# Patient Record
Sex: Female | Born: 1970 | Race: White | Hispanic: No | Marital: Married | Smoking: Former smoker
Health system: Southern US, Community
[De-identification: ages and names within clinical notes are randomized; demographics above are authoritative.]

## PROBLEM LIST (undated history)

## (undated) DIAGNOSIS — J302 Other seasonal allergic rhinitis: Secondary | ICD-10-CM

## (undated) DIAGNOSIS — D649 Anemia, unspecified: Secondary | ICD-10-CM

## (undated) DIAGNOSIS — Z8669 Personal history of other diseases of the nervous system and sense organs: Secondary | ICD-10-CM

## (undated) DIAGNOSIS — Z87442 Personal history of urinary calculi: Secondary | ICD-10-CM

## (undated) HISTORY — DX: Anemia, unspecified: D64.9

## (undated) HISTORY — PX: TUBAL LIGATION: SHX77

## (undated) HISTORY — DX: Other seasonal allergic rhinitis: J30.2

## (undated) HISTORY — DX: Personal history of urinary calculi: Z87.442

## (undated) HISTORY — DX: Personal history of other diseases of the nervous system and sense organs: Z86.69

## (undated) HISTORY — PX: OTHER SURGICAL HISTORY: SHX169

---

## 2018-02-23 LAB — HM PAP SMEAR: HM Pap smear: NEGATIVE

## 2018-11-27 ENCOUNTER — Encounter: Payer: Self-pay | Admitting: Podiatry

## 2018-11-27 ENCOUNTER — Ambulatory Visit (INDEPENDENT_AMBULATORY_CARE_PROVIDER_SITE_OTHER): Payer: Managed Care, Other (non HMO)

## 2018-11-27 ENCOUNTER — Ambulatory Visit: Payer: Managed Care, Other (non HMO) | Admitting: Podiatry

## 2018-11-27 ENCOUNTER — Other Ambulatory Visit: Payer: Self-pay

## 2018-11-27 VITALS — BP 125/79 | HR 84 | Temp 97.6°F | Resp 16

## 2018-11-27 DIAGNOSIS — M7752 Other enthesopathy of left foot: Secondary | ICD-10-CM | POA: Diagnosis not present

## 2018-11-27 DIAGNOSIS — M21962 Unspecified acquired deformity of left lower leg: Secondary | ICD-10-CM

## 2018-11-27 DIAGNOSIS — M25572 Pain in left ankle and joints of left foot: Secondary | ICD-10-CM | POA: Diagnosis not present

## 2018-11-27 DIAGNOSIS — Q828 Other specified congenital malformations of skin: Secondary | ICD-10-CM | POA: Diagnosis not present

## 2018-11-27 DIAGNOSIS — M898X9 Other specified disorders of bone, unspecified site: Secondary | ICD-10-CM | POA: Diagnosis not present

## 2018-11-27 NOTE — Progress Notes (Signed)
  Subjective:  Patient ID: Martha Burgess, female    DOB: 1971/02/07,  MRN: [id number]  Chief Complaint  Patient presents with  . foot lesion    Lt sub met 3-4th lesion x couple yrs; sore -pt states it's hard to walk on it" Tx: OTC callus removal -pt denei sN/V/F/Ch     48 y.o. female presents with the above complaint. Hx above confirmed with patient. States it has been shaved down professionally.  Also complains of a painful bony area on the top of the arch of her foot that prevents her from wearing close toed shoes as it irritates her.   Review of Systems: Negative except as noted in the HPI. Denies N/V/F/Ch.  No past medical history on file. No current outpatient medications on file.  Social History   Tobacco Use  Smoking Status Current Every Day Smoker  . Packs/day: 0.50  . Types: Cigarettes  Smokeless Tobacco Never Used    Not on File Objective:   Vitals:   11/27/18 1559  BP: 125/79  Pulse: 84  Resp: 16  Temp: 97.6 F (36.4 C)   There is no height or weight on file to calculate BMI. Constitutional Well developed. Well nourished.  Vascular Dorsalis pedis pulses palpable bilaterally. Posterior tibial pulses palpable bilaterally. Capillary refill normal to all digits.  No cyanosis or clubbing noted. Pedal hair growth normal.  Neurologic Normal speech. Oriented to person, place, and time. Epicritic sensation to light touch grossly present bilaterally.  Dermatologic Nails well groomed and normal in appearance. No open wounds. Deep hyperkeratosis about the plantar aspect of the third/fourth metatarsal head area  Orthopedic:  Pain palpation about the left third metatarsal phalangeal joint   Radiographs: Bony exostosis over the first tarsometatarsal joint area.  Malformation noted to the medial cuneiform.  Shortened first metatarsal.  Elongated second third fourth metatarsals.  Assessment:   1. Metatarsal deformity, left   2. Bony exostosis   3. Pain in joint of  left foot   4. Capsulitis of metatarsophalangeal (MTP) joint of left foot    Plan:  Patient was evaluated and treated and all questions answered.  Shortened first metatarsal elongated second third fourth metatarsals with deep hyperkeratosis and first tarsometatarsal exostosis -XR reviewed as above. -Debrided lesion for patient with 312 and 15 blade to patient relief. -Discussed with the surgical correction with patient due to the above deformities -Patient has failed all conservative therapy and wishes to proceed with surgical intervention. All risks, benefits, and alternatives discussed with patient. No guarantees given. Consent reviewed and signed by patient. -Planned procedures: Distraction arthrodesis first tarsometatarsal joint, second third possible fourth metatarsal shortening osteotomies.  Procedures may vary.  -Discussed with patient that she will need to be nonweightbearing after procedure.  Patient verbalized understanding  No follow-ups on file.

## 2018-11-27 NOTE — Patient Instructions (Signed)
Pre-Operative Instructions  Congratulations, you have decided to take an important step towards improving your quality of life.  You can be assured that the doctors and staff at [facility] will be with you every step of the way.  Here are some important things you should know:  1. Plan to be at the surgery center/hospital at least 1 (one) hour prior to your scheduled time, unless otherwise directed by the surgical center/hospital staff.  You must have a responsible adult accompany you, remain during the surgery and drive you home.  Make sure you have directions to the surgical center/hospital to ensure you arrive on time. 2. If you are having surgery at [facility] or [facility] hospitals, you will need a copy of your medical history and physical form from your family physician within one month prior to the date of surgery. We will give you a form for your primary physician to complete.  3. We make every effort to accommodate the date you request for surgery.  However, there are times where surgery dates or times have to be moved.  We will contact you as soon as possible if a change in schedule is required.   4. No aspirin/ibuprofen for one week before surgery.  If you are on aspirin, any non-steroidal anti-inflammatory medications (Mobic, Aleve, Ibuprofen) should not be taken seven (7) days prior to your surgery.  You make take Tylenol for pain prior to surgery.  5. Medications - If you are taking daily heart and blood pressure medications, seizure, reflux, allergy, asthma, anxiety, pain or diabetes medications, make sure you notify the surgery center/hospital before the day of surgery so they can tell you which medications you should take or avoid the day of surgery. 6. No food or drink after midnight the night before surgery unless directed otherwise by surgical center/hospital staff. 7. No alcoholic beverages 24-hours prior to surgery.  No smoking 24-hours prior or 24-hours after  surgery. 8. Wear loose pants or shorts. They should be loose enough to fit over bandages, boots, and casts. 9. Don't wear slip-on shoes. Sneakers are preferred. 10. Bring your boot with you to the surgery center/hospital.  Also bring crutches or a walker if your physician has prescribed it for you.  If you do not have this equipment, it will be provided for you after surgery. 11. If you have not been contacted by the surgery center/hospital by the day before your surgery, call to confirm the date and time of your surgery. 12. Leave-time from work may vary depending on the type of surgery you have.  Appropriate arrangements should be made prior to surgery with your employer. 13. Prescriptions will be provided immediately following surgery by your doctor.  Fill these as soon as possible after surgery and take the medication as directed. Pain medications will not be refilled on weekends and must be approved by the doctor. 14. Remove nail polish on the operative foot and avoid getting pedicures prior to surgery. 15. Wash the night before surgery.  The night before surgery wash the foot and leg well with water and the antibacterial soap provided. Be sure to pay special attention to beneath the toenails and in between the toes.  Wash for at least three (3) minutes. Rinse thoroughly with water and dry well with a towel.  Perform this wash unless told not to do so by your physician.  Enclosed: 1 Ice pack (please put in freezer the night before surgery)   1 Hibiclens skin cleaner     Pre-op instructions  If you have any questions regarding the instructions, please do not hesitate to call our office.  [place]: [address], [place], [place] [address] -- [phone]  [place]: [address] [place], [place] [address] -- [phone]  [place]: [address]  , [place] [address] -- [phone]  [place]: [address], [address], [place],  [address] -- [phone]  Website:  [email or web address] 

## 2018-11-27 NOTE — Progress Notes (Signed)
   Subjective:    Patient ID: Martha Burgess, female    DOB: [birth year], 48 y.o.   MRN: [id number]  HPI    Review of Systems  All other systems reviewed and are negative.      Objective:   Physical Exam        Assessment & Plan:

## 2018-11-28 ENCOUNTER — Telehealth: Payer: Self-pay | Admitting: *Deleted

## 2018-11-28 NOTE — Telephone Encounter (Signed)
"  Dr. Samuella Cota told me to give you a call about scheduling my surgery."  He does surgeries on Wednesdays.  Do you have a date that you would like?  "He said to schedule it within a month's time."  He can do it on December 27, 2018.  That date will be fine."  I'll get it scheduled.  Someone from the surgical center will give you a call a day or two prior to your surgery date.  They will give you your arrival time.  You need to register online with the surgical center through their portal, instructions are in the brochure that we gave you.

## 2018-12-22 ENCOUNTER — Telehealth: Payer: Self-pay | Admitting: *Deleted

## 2018-12-22 NOTE — Telephone Encounter (Signed)
DOS 12/27/2018, CPT CODES: 79038 X3 - METATARSAL OSTEOTOMY 2,3,4 AND 33383 - 1ST TMT ARTHODESIS  [employer]: Effective Date - Benefit Begin Date: 09/17/2018    In-Network:  Co-Insurance:  0% Individual   Deductible:  --  Individual  $3,000 per Calendar Year  Individual  $3,000 Remaining  Benefit does apply to member's out-of-pocket maximum   Out-Of-Pocket  Max:$4,000 per Calendar Year Individual  $3,736.87 Remaining Individual   PRE-CERT IS NOT REQUIRED  [address] - [phone] CONFIRM #) [id number] - ([phone] CONFIRM#)

## 2018-12-27 ENCOUNTER — Other Ambulatory Visit: Payer: Self-pay | Admitting: *Deleted

## 2018-12-27 ENCOUNTER — Encounter: Payer: Self-pay | Admitting: Podiatry

## 2018-12-27 ENCOUNTER — Other Ambulatory Visit: Payer: Self-pay | Admitting: Podiatry

## 2018-12-27 ENCOUNTER — Telehealth: Payer: Self-pay | Admitting: *Deleted

## 2018-12-27 DIAGNOSIS — M21542 Acquired clubfoot, left foot: Secondary | ICD-10-CM | POA: Diagnosis not present

## 2018-12-27 DIAGNOSIS — M21962 Unspecified acquired deformity of left lower leg: Secondary | ICD-10-CM

## 2018-12-27 DIAGNOSIS — M13872 Other specified arthritis, left ankle and foot: Secondary | ICD-10-CM | POA: Diagnosis not present

## 2018-12-27 DIAGNOSIS — M778 Other enthesopathies, not elsewhere classified: Secondary | ICD-10-CM

## 2018-12-27 MED ORDER — CEPHALEXIN 500 MG PO CAPS: 500.0000 mg | ORAL_CAPSULE | Freq: Two times a day (BID) | ORAL | 0 refills | Status: DC

## 2018-12-27 MED ORDER — ONDANSETRON HCL 4 MG PO TABS: 4.0000 mg | ORAL_TABLET | Freq: Three times a day (TID) | ORAL | 0 refills | Status: DC | PRN

## 2018-12-27 MED ORDER — OXYCODONE-ACETAMINOPHEN 10-325 MG PO TABS: 1.0000 | ORAL_TABLET | ORAL | 0 refills | Status: DC | PRN

## 2018-12-27 MED ORDER — OXYCODONE-ACETAMINOPHEN 10-325 MG PO TABS: 1.0000 | ORAL_TABLET | Freq: Four times a day (QID) | ORAL | 0 refills | Status: DC | PRN

## 2018-12-27 NOTE — Telephone Encounter (Signed)
[facility] - Martha Burgess states pt is opioid naive and will need to change directions to every 6 hour rather than every 4 hours.

## 2018-12-27 NOTE — Progress Notes (Signed)
I placed an order for a knee scooter per Dr. March Rummage.  I sent a message to Jolee Ewing of [place] requesting her assistance in getting this for the patient.

## 2018-12-27 NOTE — Telephone Encounter (Signed)
I informed [facility] - Andris Flurry, it would be fine to change pt to percocet every 6 hours prn pain.

## 2018-12-27 NOTE — Progress Notes (Signed)
Rx sent to pharmacy for outpatient surgery. °

## 2018-12-28 ENCOUNTER — Encounter: Payer: Self-pay | Admitting: Podiatry

## 2018-12-28 ENCOUNTER — Telehealth: Payer: Self-pay

## 2018-12-28 NOTE — Telephone Encounter (Signed)
POST OP CALL-    1) General condition stated by the patient:   2) Is the pt having pain? Yes but manageable  3) Pain score:   4) Has the pt taken Rx'd pain medication, regularly or PRN?   5) Is the pain medication giving relief?  6) Any fever, chills, nausea, or vomiting, shortness of breath or tightness in calf? None  7) Is the bandage clean, dry and intact? Yes  8) Is there excessive tightness, bleeding or drainage coming through the bandage?Pt states hasn't examined due to wearing the boot but hasn't noticed anything.   9) Did you understand all of the post op instruction sheet given? Yes  10) Any questions or concerns regarding post op care/recovery? Pt wanted to ask if boot could be removed. Advised not to remove boot, as it is necessary to offload pressure to prevent complications or injury. Instructed patient it would be okay to remove boot temporarily to check bandage for any issues.   Confirmed POV appointment with patient

## 2019-01-01 ENCOUNTER — Ambulatory Visit (INDEPENDENT_AMBULATORY_CARE_PROVIDER_SITE_OTHER): Payer: Managed Care, Other (non HMO)

## 2019-01-01 ENCOUNTER — Ambulatory Visit (INDEPENDENT_AMBULATORY_CARE_PROVIDER_SITE_OTHER): Payer: Managed Care, Other (non HMO) | Admitting: Podiatry

## 2019-01-01 ENCOUNTER — Other Ambulatory Visit: Payer: Self-pay

## 2019-01-01 DIAGNOSIS — M21542 Acquired clubfoot, left foot: Secondary | ICD-10-CM

## 2019-01-01 DIAGNOSIS — M7752 Other enthesopathy of left foot: Secondary | ICD-10-CM

## 2019-01-01 DIAGNOSIS — M21962 Unspecified acquired deformity of left lower leg: Secondary | ICD-10-CM

## 2019-01-01 DIAGNOSIS — Z9889 Other specified postprocedural states: Secondary | ICD-10-CM

## 2019-01-01 MED ORDER — HYDROCODONE-ACETAMINOPHEN 10-325 MG PO TABS: 1.0000 | ORAL_TABLET | ORAL | 0 refills | Status: DC | PRN

## 2019-01-01 NOTE — Progress Notes (Signed)
  Subjective:  Patient ID: Martha Burgess, female    DOB: May 24, 1971,  MRN: [id number]  Chief Complaint  Patient presents with  . Routine Post Op    POV#1 DOS 12/27/2018 METATARSAL OSTEOTOMY 2,3,4 AND 1ST TMT ARTHODESIS LT  Patient states has been doing well just staying resting as much as possible  patient denies N/V/F/CH  pain has been controlled     DOS: 12/27/2018 Procedure: L 1st TMT arthrodesis, weil osteotomies 2-4  48 y.o. female returns for post-op check. Doing well. Pain controlled with med but thinks the med is strong and makes her itch. States her knee scooter tipped in the waiting room and put pressure on her foot and had a sudden increase in pain.  Review of Systems: Negative except as noted in the HPI. Denies N/V/F/Ch.  No past medical history on file.  Current Outpatient Medications:  .  cephALEXin (KEFLEX) 500 MG capsule, Take 1 capsule (500 mg total) by mouth 2 (two) times daily., Disp: 14 capsule, Rfl: 0 .  HYDROcodone-acetaminophen (NORCO) 10-325 MG tablet, Take 1 tablet by mouth every 4 (four) hours as needed., Disp: 20 tablet, Rfl: 0 .  ondansetron (ZOFRAN) 4 MG tablet, Take 1 tablet (4 mg total) by mouth every 8 (eight) hours as needed for nausea or vomiting., Disp: 20 tablet, Rfl: 0 .  oxyCODONE-acetaminophen (PERCOCET) 10-325 MG tablet, Take 1 tablet by mouth every 6 (six) hours as needed for pain., Disp: 20 tablet, Rfl: 0  Social History   Tobacco Use  Smoking Status Current Every Day Smoker  . Packs/day: 0.50  . Types: Cigarettes  Smokeless Tobacco Never Used    Not on File Objective:  There were no vitals filed for this visit. There is no height or weight on file to calculate BMI. Constitutional Well developed. Well nourished.  Vascular Foot warm and well perfused. Capillary refill normal to all digits.   Neurologic Normal speech. Oriented to person, place, and time. Epicritic sensation to light touch grossly present bilaterally. Diminished over  the 2nd toe.  Dermatologic Skin healing well without signs of infection. Skin edges well coapted without signs of infection.  Orthopedic: Tenderness to palpation noted about the surgical site.   Radiographs: Taken and reviewed c/w post-op state hardware intact 1st TMT appears good alignment. Metatarsal parabola improvved. Assessment:   1. Post-operative state   2. Metatarsal deformity, left   3. Capsulitis of metatarsophalangeal (MTP) joint of left foot    Plan:  Patient was evaluated and treated and all questions answered.  S/p foot surgery left -Progressing as expected post-operatively. -XR: Taken and reviewed c/w post-op state -WB Status: NWB in CAM boot with knee scooter -Sutures: intact. -Medications: Rx Norco for pain -Discussed intra-op complication of tendon and skin injury. Patient verbalized understanding. -Foot redressed.  No follow-ups on file.

## 2019-01-08 ENCOUNTER — Other Ambulatory Visit: Payer: Self-pay

## 2019-01-08 ENCOUNTER — Ambulatory Visit (INDEPENDENT_AMBULATORY_CARE_PROVIDER_SITE_OTHER): Payer: Managed Care, Other (non HMO) | Admitting: Podiatry

## 2019-01-08 ENCOUNTER — Encounter: Payer: Self-pay | Admitting: Podiatry

## 2019-01-08 VITALS — BP 98/66 | HR 98 | Temp 98.7°F | Resp 16

## 2019-01-08 DIAGNOSIS — Z9889 Other specified postprocedural states: Secondary | ICD-10-CM

## 2019-01-15 ENCOUNTER — Encounter: Payer: Self-pay | Admitting: Podiatry

## 2019-01-15 ENCOUNTER — Other Ambulatory Visit: Payer: Self-pay

## 2019-01-15 ENCOUNTER — Ambulatory Visit (INDEPENDENT_AMBULATORY_CARE_PROVIDER_SITE_OTHER): Payer: Managed Care, Other (non HMO) | Admitting: Podiatry

## 2019-01-15 VITALS — Temp 98.6°F | Resp 16

## 2019-01-15 DIAGNOSIS — Z9889 Other specified postprocedural states: Secondary | ICD-10-CM

## 2019-01-15 MED ORDER — GABAPENTIN 300 MG PO CAPS: 300.0000 mg | ORAL_CAPSULE | Freq: Every day | ORAL | 0 refills | Status: DC

## 2019-01-15 MED ORDER — VITAMIN C 500 MG PO TABS: 500.0000 mg | ORAL_TABLET | Freq: Every day | ORAL | 0 refills | Status: DC

## 2019-01-15 NOTE — Progress Notes (Signed)
  Subjective:  Patient ID: Martha Burgess, female    DOB: 1970/07/30,  MRN: [id number]  Chief Complaint  Patient presents with  . Routine Post Op    OV#3 DOS 12/27/2018 METATARSAL OSTEOTOMY 2,3,4 AND 1ST TMT ARTHODESIS LT-pt states," doing pretty good, but been having a lot of tingiling in my toes and numb on my 2nd and 3rd toes; 2-3/10 pian." Tx: boot, scooter, PRN meds, icing and elevaiotn     DOS: 12/27/2018 Procedure: L 1st TMT arthrodesis, weil osteotomies 2-4  48 y.o. female returns for post-op check.  History above confirmed with patient pain controlled but having some tingling in the 2nd and 3rd toes.  Review of Systems: Negative except as noted in the HPI. Denies N/V/F/Ch.  No past medical history on file.  Current Outpatient Medications:  .  cephALEXin (KEFLEX) 500 MG capsule, Take 1 capsule (500 mg total) by mouth 2 (two) times daily., Disp: 14 capsule, Rfl: 0 .  HYDROcodone-acetaminophen (NORCO) 10-325 MG tablet, Take 1 tablet by mouth every 4 (four) hours as needed., Disp: 20 tablet, Rfl: 0 .  ondansetron (ZOFRAN) 4 MG tablet, Take 1 tablet (4 mg total) by mouth every 8 (eight) hours as needed for nausea or vomiting., Disp: 20 tablet, Rfl: 0 .  oxyCODONE-acetaminophen (PERCOCET) 10-325 MG tablet, Take 1 tablet by mouth every 6 (six) hours as needed for pain., Disp: 20 tablet, Rfl: 0 .  gabapentin (NEURONTIN) 300 MG capsule, Take 1 capsule (300 mg total) by mouth at bedtime. After 1 week take twice daily. After another week take three times daily., Disp: 30 capsule, Rfl: 0 .  vitamin C (ASCORBIC ACID) 500 MG tablet, Take 1 tablet (500 mg total) by mouth daily., Disp: 30 tablet, Rfl: 0  Social History   Tobacco Use  Smoking Status Current Every Day Smoker  . Packs/day: 0.50  . Types: Cigarettes  Smokeless Tobacco Never Used    Not on File Objective:   Vitals:   01/15/19 1620  Resp: 16  Temp: 98.6 F (37 C)   There is no height or weight on file to calculate BMI.  Constitutional Well developed. Well nourished.  Vascular Foot warm and well perfused. Capillary refill normal to all digits.   Neurologic Normal speech. Oriented to person, place, and time. Epicritic sensation to light touch grossly present bilaterally. Diminished over the 2nd toe, slight over 3rd toe.  Dermatologic Skin healing well without signs of infection. Skin edges well coapted without signs of infection.  Slight eschar between the third and fourth toes. Slight mottling of foot.  Orthopedic: Tenderness to palpation noted about the surgical site.   Radiographs: None today. Assessment:   1. Post-operative state    Plan:  Patient was evaluated and treated and all questions answered.  S/p foot surgery left -Progressing as expected post-operatively. -XR: none today. -WB Status: NWB in CAM boot with knee scooter -Sutures: removed today. Stetris applied. Ok Psychiatric nurse but not soak. -Medications: Start gabapentin. -Slight mottling of foot, concern for CRPS, especially in the setting of nerve symptoms. Start PT and Vitamin C. Restrictions: No 1st TMT ROM, No MPJ ROM. NWB LLE. Commence nerve protocol for decreased sensation over 2nd/3rd toes. -F/u in 2 weeks for new XRs.  No follow-ups on file.

## 2019-01-15 NOTE — Progress Notes (Signed)
  Subjective:  Patient ID: Martha Burgess, female    DOB: 09/05/1970,  MRN: [id number]  Chief Complaint  Patient presents with  . Routine Post Op     DOS 12/27/2018 METATARSAL OSTEOTOMY 2,3,4 AND 1ST TMT ARTHODESIS LT Pt. states," been doing pretty good, still haing itching, tingling and numbnes on 2nd toe; 4/10 sharp-shooting pain." Tx: boot, scooter, narco, icing and elevation -pt denie sN/V/F?ch     DOS: 12/27/2018 Procedure: L 1st TMT arthrodesis, weil osteotomies 2-4  48 y.o. female returns for post-op check.  History above confirmed with patient denies interval issues Review of Systems: Negative except as noted in the HPI. Denies N/V/F/Ch.  No past medical history on file.  Current Outpatient Medications:  .  cephALEXin (KEFLEX) 500 MG capsule, Take 1 capsule (500 mg total) by mouth 2 (two) times daily., Disp: 14 capsule, Rfl: 0 .  HYDROcodone-acetaminophen (NORCO) 10-325 MG tablet, Take 1 tablet by mouth every 4 (four) hours as needed., Disp: 20 tablet, Rfl: 0 .  ondansetron (ZOFRAN) 4 MG tablet, Take 1 tablet (4 mg total) by mouth every 8 (eight) hours as needed for nausea or vomiting., Disp: 20 tablet, Rfl: 0 .  oxyCODONE-acetaminophen (PERCOCET) 10-325 MG tablet, Take 1 tablet by mouth every 6 (six) hours as needed for pain., Disp: 20 tablet, Rfl: 0  Social History   Tobacco Use  Smoking Status Current Every Day Smoker  . Packs/day: 0.50  . Types: Cigarettes  Smokeless Tobacco Never Used    Not on File Objective:   Vitals:   01/08/19 1049  BP: 98/66  Pulse: 98  Resp: 16  Temp: 98.7 F (37.1 C)   There is no height or weight on file to calculate BMI. Constitutional Well developed. Well nourished.  Vascular Foot warm and well perfused. Capillary refill normal to all digits.   Neurologic Normal speech. Oriented to person, place, and time. Epicritic sensation to light touch grossly present bilaterally. Diminished over the 2nd toe.  Dermatologic Skin healing well  without signs of infection. Skin edges well coapted without signs of infection.  Slight eschar between the third and fourth toes  Orthopedic: Tenderness to palpation noted about the surgical site.   Radiographs: None today. Assessment:   1. Post-operative state    Plan:  Patient was evaluated and treated and all questions answered.  S/p foot surgery left -Progressing as expected post-operatively. -XR: none today. -WB Status: NWB in CAM boot with knee scooter -Sutures: intact. -Medications: none refilled today. -F/u in 1 week for possible suture removal  No follow-ups on file.

## 2019-01-29 ENCOUNTER — Ambulatory Visit (INDEPENDENT_AMBULATORY_CARE_PROVIDER_SITE_OTHER): Payer: Managed Care, Other (non HMO) | Admitting: Podiatry

## 2019-01-29 ENCOUNTER — Other Ambulatory Visit: Payer: Self-pay

## 2019-01-29 ENCOUNTER — Ambulatory Visit (INDEPENDENT_AMBULATORY_CARE_PROVIDER_SITE_OTHER): Payer: Managed Care, Other (non HMO)

## 2019-01-29 DIAGNOSIS — M7752 Other enthesopathy of left foot: Secondary | ICD-10-CM

## 2019-01-29 DIAGNOSIS — Z9889 Other specified postprocedural states: Secondary | ICD-10-CM

## 2019-01-29 DIAGNOSIS — M21962 Unspecified acquired deformity of left lower leg: Secondary | ICD-10-CM

## 2019-01-29 MED ORDER — HYDROCODONE-ACETAMINOPHEN 10-325 MG PO TABS: 1.0000 | ORAL_TABLET | ORAL | 0 refills | Status: DC | PRN

## 2019-01-29 MED ORDER — GABAPENTIN 300 MG PO CAPS: 300.0000 mg | ORAL_CAPSULE | Freq: Every day | ORAL | 0 refills | Status: DC

## 2019-01-29 NOTE — Progress Notes (Signed)
  Subjective:  Patient ID: Martha Burgess, female    DOB: [birth year],  MRN: [id number]  No chief complaint on file.   DOS: 12/27/2018 Procedure: L 1st TMT arthrodesis, weil osteotomies 2-4  48 y.o. female returns for post-op check.  States the gabapentin helps, but PT makes her toes tingle. Pain 2/10. Denies swelling. Denies post-op issues.  Review of Systems: Negative except as noted in the HPI. Denies N/V/F/Ch.  No past medical history on file.  Current Outpatient Medications:  .  cephALEXin (KEFLEX) 500 MG capsule, Take 1 capsule (500 mg total) by mouth 2 (two) times daily., Disp: 14 capsule, Rfl: 0 .  gabapentin (NEURONTIN) 300 MG capsule, Take 1 capsule (300 mg total) by mouth at bedtime. After 1 week take twice daily. After another week take three times daily., Disp: 30 capsule, Rfl: 0 .  HYDROcodone-acetaminophen (NORCO) 10-325 MG tablet, Take 1 tablet by mouth every 4 (four) hours as needed., Disp: 20 tablet, Rfl: 0 .  ondansetron (ZOFRAN) 4 MG tablet, Take 1 tablet (4 mg total) by mouth every 8 (eight) hours as needed for nausea or vomiting., Disp: 20 tablet, Rfl: 0 .  oxyCODONE-acetaminophen (PERCOCET) 10-325 MG tablet, Take 1 tablet by mouth every 6 (six) hours as needed for pain., Disp: 20 tablet, Rfl: 0 .  vitamin C (ASCORBIC ACID) 500 MG tablet, Take 1 tablet (500 mg total) by mouth daily., Disp: 30 tablet, Rfl: 0  Social History   Tobacco Use  Smoking Status Current Every Day Smoker  . Packs/day: 0.50  . Types: Cigarettes  Smokeless Tobacco Never Used    Not on File Objective:   There were no vitals filed for this visit. There is no height or weight on file to calculate BMI. Constitutional Well developed. Well nourished.  Vascular Foot warm and well perfused. Capillary refill normal to all digits.   Neurologic Normal speech. Oriented to person, place, and time. Epicritic sensation to light touch grossly present bilaterally. Diminished over the 2nd toe, slight  over 3rd toe.  Dermatologic Skin healing well without signs of infection. Skin edges well coapted without signs of infection.  Slight eschar between the third and fourth toes. Slight mottling of foot.  Orthopedic: Tenderness to palpation noted about the surgical site.   Radiographs: XR taken and reviewed. No HWF. 1st TMT site appears in good alignment without bridging.. Assessment:   1. Post-operative state   2. Metatarsal deformity, left   3. Capsulitis of metatarsophalangeal (MTP) joint of left foot    Plan:  Patient was evaluated and treated and all questions answered.  S/p foot surgery left -Progressing as expected post-operatively. -XR: taken and reviewed. Fixation intact.  -WB Status: NWB in CAM boot with knee scooter -Medications: Refill Gabapentin and Norco -Continue PT and Vitamin C. Restrictions: No 1st TMT ROM, No MPJ ROM. NWB LLE. Commence nerve protocol for decreased sensation over 2nd/3rd toes. -F/u in 2 weeks for new XRs.  Return in about 2 weeks (around 02/12/2019) for Post-op, XRs, Right.

## 2019-01-31 ENCOUNTER — Telehealth: Payer: Self-pay | Admitting: *Deleted

## 2019-01-31 NOTE — Telephone Encounter (Signed)
[facility] - Seth Bake request clarification of orders and LOV notes, fax to [phone]. Referral, 01/29/2019 clinicals faxed to [facility]

## 2019-02-13 ENCOUNTER — Ambulatory Visit (INDEPENDENT_AMBULATORY_CARE_PROVIDER_SITE_OTHER): Payer: Self-pay | Admitting: Podiatry

## 2019-02-13 ENCOUNTER — Encounter: Payer: Self-pay | Admitting: Podiatry

## 2019-02-13 ENCOUNTER — Other Ambulatory Visit: Payer: Self-pay | Admitting: Podiatry

## 2019-02-13 ENCOUNTER — Other Ambulatory Visit: Payer: Self-pay

## 2019-02-13 ENCOUNTER — Ambulatory Visit (INDEPENDENT_AMBULATORY_CARE_PROVIDER_SITE_OTHER): Payer: Managed Care, Other (non HMO)

## 2019-02-13 VITALS — Temp 99.3°F | Resp 16

## 2019-02-13 DIAGNOSIS — M7752 Other enthesopathy of left foot: Secondary | ICD-10-CM

## 2019-02-13 DIAGNOSIS — Z9889 Other specified postprocedural states: Secondary | ICD-10-CM

## 2019-02-18 NOTE — Progress Notes (Signed)
  Subjective:  Patient ID: Martha Burgess, female    DOB: 06-05-1971,  MRN: [id number]  Chief Complaint  Patient presents with  . Routine Post Op    Pt. states," it's been good last ocuple day; 1-2/10 pain.' tx: gaba, PRN meds, scooter, boot, and icing -pt denies N/V/F/ch -w/ swelling when doing lots of walking     DOS: 12/27/2018 Procedure: L 1st TMT arthrodesis, weil osteotomies 2-4  48 y.o. female returns for post-op check.  History as above not having much pain.  Review of Systems: Negative except as noted in the HPI. Denies N/V/F/Ch.  No past medical history on file.  Current Outpatient Medications:  .  cephALEXin (KEFLEX) 500 MG capsule, Take 1 capsule (500 mg total) by mouth 2 (two) times daily., Disp: 14 capsule, Rfl: 0 .  gabapentin (NEURONTIN) 300 MG capsule, Take 1 capsule (300 mg total) by mouth at bedtime. After 1 week take twice daily. After another week take three times daily., Disp: 30 capsule, Rfl: 0 .  HYDROcodone-acetaminophen (NORCO) 10-325 MG tablet, Take 1 tablet by mouth every 4 (four) hours as needed., Disp: 20 tablet, Rfl: 0 .  ondansetron (ZOFRAN) 4 MG tablet, Take 1 tablet (4 mg total) by mouth every 8 (eight) hours as needed for nausea or vomiting., Disp: 20 tablet, Rfl: 0 .  oxyCODONE-acetaminophen (PERCOCET) 10-325 MG tablet, Take 1 tablet by mouth every 6 (six) hours as needed for pain., Disp: 20 tablet, Rfl: 0 .  vitamin C (ASCORBIC ACID) 500 MG tablet, Take 1 tablet (500 mg total) by mouth daily., Disp: 30 tablet, Rfl: 0  Social History   Tobacco Use  Smoking Status Current Every Day Smoker  . Packs/day: 0.50  . Types: Cigarettes  Smokeless Tobacco Never Used    Not on File Objective:   Vitals:   02/13/19 1557  Resp: 16  Temp: 99.3 F (37.4 C)   There is no height or weight on file to calculate BMI. Constitutional Well developed. Well nourished.  Vascular Foot warm and well perfused. Capillary refill normal to all digits.   Neurologic  Normal speech. Oriented to person, place, and time. Epicritic sensation to light touch grossly present bilaterally. Diminished over the 2nd toe, slight over 3rd toe.  Dermatologic Skin healing with only slight area of eschar that is left intact.  Skin color is still slightly mottled.  Orthopedic: Tenderness to palpation noted about the surgical site.   Radiographs: XR taken and reviewed.  No hardware failure bridging noted across the first tarsometatarsal joint Assessment:   1. Post-operative state    Plan:  Patient was evaluated and treated and all questions answered.  S/p foot surgery left -Progressing as expected post-operatively. -XR: taken and reviewed.  Early bridging noted -WB Status: NWB in CAM boot with knee scooter.  Advised she can start weightbearing next week only at home with her cam boot on. -Medications: None refilled today -Continue PT and Vitamin C. Restrictions: No 1st TMT ROM, No MPJ ROM. NWB LLE. Commence nerve protocol for decreased sensation over 2nd/3rd toes. -F/u in 2 weeks for new XRs.  No follow-ups on file.

## 2019-02-27 ENCOUNTER — Other Ambulatory Visit: Payer: Self-pay | Admitting: Podiatry

## 2019-02-27 ENCOUNTER — Encounter: Payer: Self-pay | Admitting: Podiatry

## 2019-02-27 ENCOUNTER — Ambulatory Visit (INDEPENDENT_AMBULATORY_CARE_PROVIDER_SITE_OTHER): Payer: Self-pay | Admitting: Podiatry

## 2019-02-27 ENCOUNTER — Ambulatory Visit: Payer: Self-pay

## 2019-02-27 ENCOUNTER — Other Ambulatory Visit: Payer: Self-pay

## 2019-02-27 VITALS — Temp 98.7°F | Resp 16

## 2019-02-27 DIAGNOSIS — Z9889 Other specified postprocedural states: Secondary | ICD-10-CM

## 2019-02-27 DIAGNOSIS — M778 Other enthesopathies, not elsewhere classified: Secondary | ICD-10-CM

## 2019-02-27 DIAGNOSIS — M779 Enthesopathy, unspecified: Secondary | ICD-10-CM

## 2019-02-27 DIAGNOSIS — M7752 Other enthesopathy of left foot: Secondary | ICD-10-CM

## 2019-02-27 DIAGNOSIS — M21962 Unspecified acquired deformity of left lower leg: Secondary | ICD-10-CM

## 2019-02-27 NOTE — Progress Notes (Signed)
  Subjective:  Patient ID: Martha Burgess, female    DOB: 04-26-1971,  MRN: [id number]  Chief Complaint  Patient presents with  . Routine Post Op    PT. states," since PT it's been very sore, otherwise it's doing okay; 5/10 soreness." Tx: Boot, PT, vits and icign -pt denies N/V/F/ch   DOS: 12/27/2018 Procedure: L 1st TMT arthrodesis, weil osteotomies 2-4  48 y.o. female returns for post-op check.  History as above only sore after PT.  Review of Systems: Negative except as noted in the HPI. Denies N/V/F/Ch.  No past medical history on file.  Current Outpatient Medications:  .  cephALEXin (KEFLEX) 500 MG capsule, Take 1 capsule (500 mg total) by mouth 2 (two) times daily., Disp: 14 capsule, Rfl: 0 .  gabapentin (NEURONTIN) 300 MG capsule, Take 1 capsule (300 mg total) by mouth at bedtime. After 1 week take twice daily. After another week take three times daily., Disp: 30 capsule, Rfl: 0 .  HYDROcodone-acetaminophen (NORCO) 10-325 MG tablet, Take 1 tablet by mouth every 4 (four) hours as needed., Disp: 20 tablet, Rfl: 0 .  ondansetron (ZOFRAN) 4 MG tablet, Take 1 tablet (4 mg total) by mouth every 8 (eight) hours as needed for nausea or vomiting., Disp: 20 tablet, Rfl: 0 .  oxyCODONE-acetaminophen (PERCOCET) 10-325 MG tablet, Take 1 tablet by mouth every 6 (six) hours as needed for pain., Disp: 20 tablet, Rfl: 0 .  vitamin C (ASCORBIC ACID) 500 MG tablet, Take 1 tablet (500 mg total) by mouth daily., Disp: 30 tablet, Rfl: 0  Social History   Tobacco Use  Smoking Status Current Every Day Smoker  . Packs/day: 0.50  . Types: Cigarettes  Smokeless Tobacco Never Used    Not on File Objective:   Vitals:   02/27/19 1611  Resp: 16  Temp: 98.7 F (37.1 C)   There is no height or weight on file to calculate BMI. Constitutional Well developed. Well nourished.  Vascular Foot warm and well perfused. Capillary refill normal to all digits.   Neurologic Normal speech. Oriented to person,  place, and time. Epicritic sensation to light touch grossly present bilaterally. Diminished over the 2nd toe, slight over 3rd toe.  Dermatologic Skin healing with only slight area of incomplete healing.  Orthopedic: Tenderness to palpation noted about the surgical site.   Radiographs: XR taken and reviewed.  No hardware failure, evidence of bridging. Assessment:   1. Metatarsal deformity, left   2. Capsulitis of metatarsophalangeal (MTP) joint of left foot   3. Deformity of metatarsal, left   4. Capsulitis of foot, left   5. Post-operative state    Plan:  Patient was evaluated and treated and all questions answered.  S/p foot surgery left -Progressing as expected post-operatively. -XR: taken and reviewed.  Signs of bridging. -WB Status: WBAT in CAM boot -Medications: None refilled today -Continue PT and Vitamin C. Restrictions: No 1st TMT ROM, No MPJ ROM. NWB LLE. Commence nerve protocol for decreased sensation over 2nd/3rd toes. -Recc B12 for nerve symptoms. -F/u in 2 weeks for new XRs.  Return in about 2 weeks (around 03/13/2019) for Post-op.

## 2019-03-13 ENCOUNTER — Other Ambulatory Visit: Payer: Self-pay | Admitting: Podiatry

## 2019-03-13 ENCOUNTER — Ambulatory Visit (INDEPENDENT_AMBULATORY_CARE_PROVIDER_SITE_OTHER): Payer: Managed Care, Other (non HMO)

## 2019-03-13 ENCOUNTER — Ambulatory Visit (INDEPENDENT_AMBULATORY_CARE_PROVIDER_SITE_OTHER): Payer: Managed Care, Other (non HMO) | Admitting: Podiatry

## 2019-03-13 ENCOUNTER — Other Ambulatory Visit: Payer: Self-pay

## 2019-03-13 DIAGNOSIS — Z9889 Other specified postprocedural states: Secondary | ICD-10-CM

## 2019-03-13 DIAGNOSIS — M21962 Unspecified acquired deformity of left lower leg: Secondary | ICD-10-CM | POA: Diagnosis not present

## 2019-03-19 ENCOUNTER — Other Ambulatory Visit: Payer: Self-pay | Admitting: Podiatry

## 2019-03-19 DIAGNOSIS — M7752 Other enthesopathy of left foot: Secondary | ICD-10-CM

## 2019-03-19 DIAGNOSIS — M21962 Unspecified acquired deformity of left lower leg: Secondary | ICD-10-CM

## 2019-03-19 DIAGNOSIS — Z9889 Other specified postprocedural states: Secondary | ICD-10-CM

## 2019-04-02 ENCOUNTER — Other Ambulatory Visit: Payer: Self-pay

## 2019-04-02 ENCOUNTER — Ambulatory Visit (INDEPENDENT_AMBULATORY_CARE_PROVIDER_SITE_OTHER): Payer: Managed Care, Other (non HMO)

## 2019-04-02 ENCOUNTER — Ambulatory Visit (INDEPENDENT_AMBULATORY_CARE_PROVIDER_SITE_OTHER): Payer: Managed Care, Other (non HMO) | Admitting: Podiatry

## 2019-04-02 DIAGNOSIS — Z9889 Other specified postprocedural states: Secondary | ICD-10-CM

## 2019-04-02 DIAGNOSIS — M21962 Unspecified acquired deformity of left lower leg: Secondary | ICD-10-CM

## 2019-04-02 DIAGNOSIS — M7752 Other enthesopathy of left foot: Secondary | ICD-10-CM

## 2019-04-02 NOTE — Progress Notes (Signed)
  Subjective:  Patient ID: Martha Burgess, female    DOB: Mar 03, 1971,  MRN: [id number]  Chief Complaint  Patient presents with  . Foot Pain    pt is here for a f/u on foot pain ,pt states that she is feeling a lot better since the last time she was here, pt states that she is concerned in the numbness of her second and third toes on her left foot    DOS: 12/27/2018 Procedure: L 1st TMT arthrodesis, weil osteotomies 2-4  48 y.o. female returns for post-op check.  History as above. States foot is doing much better than it previously has been doing.  Review of Systems: Negative except as noted in the HPI. Denies N/V/F/Ch.  No past medical history on file.  Current Outpatient Medications:  .  cephALEXin (KEFLEX) 500 MG capsule, Take 1 capsule (500 mg total) by mouth 2 (two) times daily., Disp: 14 capsule, Rfl: 0 .  gabapentin (NEURONTIN) 300 MG capsule, Take 1 capsule (300 mg total) by mouth at bedtime. After 1 week take twice daily. After another week take three times daily., Disp: 30 capsule, Rfl: 0 .  HYDROcodone-acetaminophen (NORCO) 10-325 MG tablet, Take 1 tablet by mouth every 4 (four) hours as needed., Disp: 20 tablet, Rfl: 0 .  ondansetron (ZOFRAN) 4 MG tablet, Take 1 tablet (4 mg total) by mouth every 8 (eight) hours as needed for nausea or vomiting., Disp: 20 tablet, Rfl: 0 .  oxyCODONE-acetaminophen (PERCOCET) 10-325 MG tablet, Take 1 tablet by mouth every 6 (six) hours as needed for pain., Disp: 20 tablet, Rfl: 0 .  vitamin C (ASCORBIC ACID) 500 MG tablet, Take 1 tablet (500 mg total) by mouth daily., Disp: 30 tablet, Rfl: 0  Social History   Tobacco Use  Smoking Status Current Every Day Smoker  . Packs/day: 0.50  . Types: Cigarettes  Smokeless Tobacco Never Used    No Known Allergies Objective:   There were no vitals filed for this visit. There is no height or weight on file to calculate BMI. Constitutional Well developed. Well nourished.  Vascular Foot warm and well  perfused. Capillary refill normal to all digits.   Neurologic Normal speech. Oriented to person, place, and time. Epicritic sensation to light touch grossly present bilaterally. Diminished over the 2nd toe, slight over 3rd toe.  Dermatologic Skin healed. Skin color normalized.  Orthopedic: Tenderness to palpation noted about the surgical site.   Radiographs: XR taken and reviewed. Bridging noted, no hardware failure or loosening. Assessment:   1. Metatarsal deformity, left   2. Post-operative state   3. Capsulitis of metatarsophalangeal (MTP) joint of left foot   4. Deformity of metatarsal, left    Plan:  Patient was evaluated and treated and all questions answered.  S/p foot surgery left -Progressing as expected post-operatively. -XR: taken and reviewed.  . -WB Status: WBAT in surgical shoe for 2 weeks. -Injection delivered to the left 2nd interspace to see if this could help with nerve symptoms in case part of these symptoms are from a neuroma.  -F/u in 2 weeks for new XRs.  Procedure: Neuroma Injection Location: Left 2nd interspace Skin Prep: Alcohol. Injectate: 0.5 cc 0.5% marcaine plain, 0.5 cc dexamethasone phosphate. Disposition: Patient tolerated procedure well. Injection site dressed with a band-aid.  No follow-ups on file. Plan for transition to normal shoe with inserts at next visit.

## 2019-04-03 ENCOUNTER — Encounter: Payer: Managed Care, Other (non HMO) | Admitting: Podiatry

## 2019-04-15 NOTE — Progress Notes (Signed)
  Subjective:  Patient ID: Martha Burgess, female    DOB: [birth year],  MRN: [id number]  No chief complaint on file.  DOS: 12/27/2018 Procedure: L 1st TMT arthrodesis, weil osteotomies 2-4  48 y.o. female returns for post-op check.  States that is been doing pretty good still swells a lot of thumb on it too much.  Pain not too bad rated 1-2 out of 10.  Has been using vitamins wearing the boot and going to physical therapy.  Denies warmth  Review of Systems: Negative except as noted in the HPI. Denies N/V/F/Ch.  No past medical history on file.  Current Outpatient Medications:  .  cephALEXin (KEFLEX) 500 MG capsule, Take 1 capsule (500 mg total) by mouth 2 (two) times daily., Disp: 14 capsule, Rfl: 0 .  gabapentin (NEURONTIN) 300 MG capsule, Take 1 capsule (300 mg total) by mouth at bedtime. After 1 week take twice daily. After another week take three times daily., Disp: 30 capsule, Rfl: 0 .  HYDROcodone-acetaminophen (NORCO) 10-325 MG tablet, Take 1 tablet by mouth every 4 (four) hours as needed., Disp: 20 tablet, Rfl: 0 .  ondansetron (ZOFRAN) 4 MG tablet, Take 1 tablet (4 mg total) by mouth every 8 (eight) hours as needed for nausea or vomiting., Disp: 20 tablet, Rfl: 0 .  oxyCODONE-acetaminophen (PERCOCET) 10-325 MG tablet, Take 1 tablet by mouth every 6 (six) hours as needed for pain., Disp: 20 tablet, Rfl: 0 .  vitamin C (ASCORBIC ACID) 500 MG tablet, Take 1 tablet (500 mg total) by mouth daily., Disp: 30 tablet, Rfl: 0  Social History   Tobacco Use  Smoking Status Current Every Day Smoker  . Packs/day: 0.50  . Types: Cigarettes  Smokeless Tobacco Never Used    No Known Allergies Objective:   There were no vitals filed for this visit. There is no height or weight on file to calculate BMI. Constitutional Well developed. Well nourished.  Vascular Foot warm and well perfused. Capillary refill normal to all digits.   Neurologic Normal speech. Oriented to person, place, and  time. Epicritic sensation to light touch grossly present bilaterally. Diminished over the 2nd toe, slight over 3rd toe.  Dermatologic Skin healed  Orthopedic: Tenderness to palpation noted about the surgical sites. First tarsometatarsal joint appears clinically fused   Radiographs: X-rays taken reviewed signs of bridging noted Assessment:   1. Post-operative state    Plan:  Patient was evaluated and treated and all questions answered.  S/p foot surgery left -Progressing as expected post-operatively. -XR: taken and reviewed. -WB Status: WBAT in surgical shoe.  Outside of shoes and the patient size advised to come back next week for the shoe. -Tubigrip dispensed -Medications: None refilled today  -F/u in 2 weeks for new XRs.  No follow-ups on file.

## 2019-04-17 ENCOUNTER — Other Ambulatory Visit: Payer: Self-pay

## 2019-04-17 ENCOUNTER — Ambulatory Visit (INDEPENDENT_AMBULATORY_CARE_PROVIDER_SITE_OTHER): Payer: Managed Care, Other (non HMO) | Admitting: Podiatry

## 2019-04-17 ENCOUNTER — Other Ambulatory Visit: Payer: Self-pay | Admitting: Podiatry

## 2019-04-17 ENCOUNTER — Ambulatory Visit (INDEPENDENT_AMBULATORY_CARE_PROVIDER_SITE_OTHER): Payer: Managed Care, Other (non HMO)

## 2019-04-17 DIAGNOSIS — Z9889 Other specified postprocedural states: Secondary | ICD-10-CM

## 2019-04-17 DIAGNOSIS — G588 Other specified mononeuropathies: Secondary | ICD-10-CM

## 2019-04-17 DIAGNOSIS — M7752 Other enthesopathy of left foot: Secondary | ICD-10-CM | POA: Diagnosis not present

## 2019-04-17 DIAGNOSIS — M21962 Unspecified acquired deformity of left lower leg: Secondary | ICD-10-CM

## 2019-04-17 DIAGNOSIS — M7751 Other enthesopathy of right foot: Secondary | ICD-10-CM

## 2019-04-17 NOTE — Progress Notes (Signed)
  Subjective:  Patient ID: Martha Burgess, female    DOB: July 11, 1971,  MRN: [id number]  Chief Complaint  Patient presents with  . Routine Post Op    Pt. states," doing better iwht pain.  I still have some numbness goind down the sid eof my foot and my 34rd toe; 1/10 soreness.' -w/ swelling Tx: sx shoe   DOS: 12/27/2018 Procedure: L 1st TMT arthrodesis, weil osteotomies 2-4  48 y.o. female returns for post-op check.  History as above. States the sensation is coming back to the 3rd toe after the injection. 2nd toe still numb.  Review of Systems: Negative except as noted in the HPI. Denies N/V/F/Ch.  No past medical history on file.  Current Outpatient Medications:  .  cephALEXin (KEFLEX) 500 MG capsule, Take 1 capsule (500 mg total) by mouth 2 (two) times daily., Disp: 14 capsule, Rfl: 0 .  gabapentin (NEURONTIN) 300 MG capsule, Take 1 capsule (300 mg total) by mouth at bedtime. After 1 week take twice daily. After another week take three times daily., Disp: 30 capsule, Rfl: 0 .  HYDROcodone-acetaminophen (NORCO) 10-325 MG tablet, Take 1 tablet by mouth every 4 (four) hours as needed., Disp: 20 tablet, Rfl: 0 .  ondansetron (ZOFRAN) 4 MG tablet, Take 1 tablet (4 mg total) by mouth every 8 (eight) hours as needed for nausea or vomiting., Disp: 20 tablet, Rfl: 0 .  oxyCODONE-acetaminophen (PERCOCET) 10-325 MG tablet, Take 1 tablet by mouth every 6 (six) hours as needed for pain., Disp: 20 tablet, Rfl: 0 .  vitamin C (ASCORBIC ACID) 500 MG tablet, Take 1 tablet (500 mg total) by mouth daily., Disp: 30 tablet, Rfl: 0  Social History   Tobacco Use  Smoking Status Current Every Day Smoker  . Packs/day: 0.50  . Types: Cigarettes  Smokeless Tobacco Never Used    No Known Allergies Objective:   There were no vitals filed for this visit. There is no height or weight on file to calculate BMI. Constitutional Well developed. Well nourished.  Vascular Foot warm and well perfused. Capillary  refill normal to all digits.   Neurologic Normal speech. Oriented to person, place, and time. Epicritic sensation to light touch grossly present bilaterally. Diminished over the 2nd toe, slight over 3rd toe.  Dermatologic Skin healed. Skin color normalized.  Orthopedic: No tenderness to palpation noted about the surgical sites.   Radiographs: XR taken and reviewed. Bridging without hardware failure noted. Assessment:   1. Neuroma digital nerve   2. Metatarsal deformity, left   3. Capsulitis of metatarsophalangeal (MTP) joint of left foot    Plan:  Patient was evaluated and treated and all questions answered.  S/p foot surgery left -Progressing as expected post-operatively. -XR: taken and reviewed. -WB Status: WBAT in normal shoegear with OTC inserts. Cast for CMOs today. -Injection has helped nerve symptoms; repeat today. -F/u in 2 weeks for new XRs.  Procedure: Neuroma Injection Location: Left 2nd interspace Skin Prep: Alcohol. Injectate: 0.5 cc 0.5% marcaine plain, 1 cc dexamethasone phosphate. Disposition: Patient tolerated procedure well. Injection site dressed with a band-aid.   No follow-ups on file.

## 2019-04-18 ENCOUNTER — Other Ambulatory Visit: Payer: Self-pay | Admitting: Podiatry

## 2019-04-18 DIAGNOSIS — Z9889 Other specified postprocedural states: Secondary | ICD-10-CM

## 2019-04-18 DIAGNOSIS — G588 Other specified mononeuropathies: Secondary | ICD-10-CM

## 2019-04-30 ENCOUNTER — Other Ambulatory Visit: Payer: Self-pay | Admitting: Podiatry

## 2019-04-30 ENCOUNTER — Ambulatory Visit (INDEPENDENT_AMBULATORY_CARE_PROVIDER_SITE_OTHER): Payer: Managed Care, Other (non HMO)

## 2019-04-30 ENCOUNTER — Ambulatory Visit (INDEPENDENT_AMBULATORY_CARE_PROVIDER_SITE_OTHER): Payer: Managed Care, Other (non HMO) | Admitting: Podiatry

## 2019-04-30 ENCOUNTER — Other Ambulatory Visit: Payer: Self-pay

## 2019-04-30 DIAGNOSIS — Z9889 Other specified postprocedural states: Secondary | ICD-10-CM

## 2019-04-30 DIAGNOSIS — G588 Other specified mononeuropathies: Secondary | ICD-10-CM

## 2019-04-30 DIAGNOSIS — M21962 Unspecified acquired deformity of left lower leg: Secondary | ICD-10-CM | POA: Diagnosis not present

## 2019-04-30 NOTE — Progress Notes (Signed)
  Subjective:  Patient ID: Martha Burgess, female    DOB: 1970-12-21,  MRN: [id number]  No chief complaint on file.  DOS: 12/27/2018 Procedure: L 1st TMT arthrodesis, weil osteotomies 2-4  48 y.o. female returns for post-op check.  History as above. Sensation back to the 2nd toe, still numb to the third toe. Injections are helping. Occasional pains to the arch. Complains of stiffness in her toes and inability to bend the digits.  Review of Systems: Negative except as noted in the HPI. Denies N/V/F/Ch.  No past medical history on file.  Current Outpatient Medications:  .  cephALEXin (KEFLEX) 500 MG capsule, Take 1 capsule (500 mg total) by mouth 2 (two) times daily., Disp: 14 capsule, Rfl: 0 .  gabapentin (NEURONTIN) 300 MG capsule, Take 1 capsule (300 mg total) by mouth at bedtime. After 1 week take twice daily. After another week take three times daily., Disp: 30 capsule, Rfl: 0 .  HYDROcodone-acetaminophen (NORCO) 10-325 MG tablet, Take 1 tablet by mouth every 4 (four) hours as needed., Disp: 20 tablet, Rfl: 0 .  ondansetron (ZOFRAN) 4 MG tablet, Take 1 tablet (4 mg total) by mouth every 8 (eight) hours as needed for nausea or vomiting., Disp: 20 tablet, Rfl: 0 .  oxyCODONE-acetaminophen (PERCOCET) 10-325 MG tablet, Take 1 tablet by mouth every 6 (six) hours as needed for pain., Disp: 20 tablet, Rfl: 0 .  vitamin C (ASCORBIC ACID) 500 MG tablet, Take 1 tablet (500 mg total) by mouth daily., Disp: 30 tablet, Rfl: 0  Social History   Tobacco Use  Smoking Status Current Every Day Smoker  . Packs/day: 0.50  . Types: Cigarettes  Smokeless Tobacco Never Used    No Known Allergies Objective:   There were no vitals filed for this visit. There is no height or weight on file to calculate BMI. Constitutional Well developed. Well nourished.  Vascular Foot warm and well perfused. Capillary refill normal to all digits.   Neurologic Normal speech. Oriented to person, place, and time.  Epicritic sensation to light touch grossly present bilaterally. Sensation intact left 2nd toe, diminshed left 3rd toe  Dermatologic Skin healed. Skin color normalized.  Orthopedic: No tenderness to palpation noted about the surgical sites.   Radiographs: XR taken and reviewed. Bridging without hardware failure noted. Assessment:   1. Neuroma digital nerve   2. Metatarsal deformity, left    Plan:  Patient was evaluated and treated and all questions answered.  S/p foot surgery left -Progressing as expected post-operatively. -XR: taken and reviewed. -Awaiting CMOs. Continue WBAT in normal shoegear -Injection has helped nerve symptoms; repeat again today. Injection #3 -Will dispense to alignment splint to help with stretching at MPJs. None available today to dispense.  Procedure: Neuroma Injection Location: Left 2nd interspace Skin Prep: Alcohol. Injectate: 0.5 cc 0.5% marcaine plain, 0.5 cc dexamethasone phosphate. Disposition: Patient tolerated procedure well. Injection site dressed with a band-aid.   Return in about 3 weeks (around 05/21/2019) for F/u .

## 2019-05-02 ENCOUNTER — Ambulatory Visit (INDEPENDENT_AMBULATORY_CARE_PROVIDER_SITE_OTHER): Payer: Self-pay | Admitting: Podiatry

## 2019-05-02 ENCOUNTER — Other Ambulatory Visit: Payer: Self-pay

## 2019-05-02 DIAGNOSIS — Z9889 Other specified postprocedural states: Secondary | ICD-10-CM

## 2019-05-02 NOTE — Progress Notes (Signed)
Patient ID: Martha Burgess, female   DOB: [birth year], 48 y.o.   MRN: [id number] Patient presents for orthotic pick up.  Verbal and written break in and wear instructions given.  Patient will follow up in 4 weeks with Dr March Rummage if symptoms worsen or fail to improve.

## 2019-05-02 NOTE — Patient Instructions (Signed)

## 2019-05-08 ENCOUNTER — Other Ambulatory Visit: Payer: Self-pay | Admitting: Podiatry

## 2019-05-08 DIAGNOSIS — M21962 Unspecified acquired deformity of left lower leg: Secondary | ICD-10-CM

## 2019-05-21 ENCOUNTER — Ambulatory Visit (INDEPENDENT_AMBULATORY_CARE_PROVIDER_SITE_OTHER): Payer: Managed Care, Other (non HMO) | Admitting: Podiatry

## 2019-05-21 ENCOUNTER — Other Ambulatory Visit: Payer: Self-pay

## 2019-05-21 DIAGNOSIS — M7752 Other enthesopathy of left foot: Secondary | ICD-10-CM

## 2019-05-21 DIAGNOSIS — M21962 Unspecified acquired deformity of left lower leg: Secondary | ICD-10-CM | POA: Diagnosis not present

## 2019-05-22 NOTE — Progress Notes (Signed)
  Subjective:  Patient ID: Martha Burgess, female    DOB: August 15, 1970,  MRN: [id number]  Chief Complaint  Patient presents with  . Routine Post Op    POV Pt. states," been doing pretty good. With the inserts it makes my foot hurt a little bit, but I'm trying ot get used to them." Tx: inserts and IBU -pt states she can only werar her inserts 4-5 hrs/day    DOS: 12/27/2018 Procedure: L 1st TMT arthrodesis, weil osteotomies 2-4  48 y.o. female returns for post-op check.  History as above. Sensation intact to second toe slightly improving to third but still diminished.   Review of Systems: Negative except as noted in the HPI. Denies N/V/F/Ch.  No past medical history on file.  Current Outpatient Medications:  .  cephALEXin (KEFLEX) 500 MG capsule, Take 1 capsule (500 mg total) by mouth 2 (two) times daily., Disp: 14 capsule, Rfl: 0 .  gabapentin (NEURONTIN) 300 MG capsule, Take 1 capsule (300 mg total) by mouth at bedtime. After 1 week take twice daily. After another week take three times daily., Disp: 30 capsule, Rfl: 0 .  HYDROcodone-acetaminophen (NORCO) 10-325 MG tablet, Take 1 tablet by mouth every 4 (four) hours as needed., Disp: 20 tablet, Rfl: 0 .  ondansetron (ZOFRAN) 4 MG tablet, Take 1 tablet (4 mg total) by mouth every 8 (eight) hours as needed for nausea or vomiting., Disp: 20 tablet, Rfl: 0 .  oxyCODONE-acetaminophen (PERCOCET) 10-325 MG tablet, Take 1 tablet by mouth every 6 (six) hours as needed for pain., Disp: 20 tablet, Rfl: 0 .  vitamin C (ASCORBIC ACID) 500 MG tablet, Take 1 tablet (500 mg total) by mouth daily., Disp: 30 tablet, Rfl: 0  Social History   Tobacco Use  Smoking Status Current Every Day Smoker  . Packs/day: 0.50  . Types: Cigarettes  Smokeless Tobacco Never Used    No Known Allergies Objective:   There were no vitals filed for this visit. There is no height or weight on file to calculate BMI. Constitutional Well developed. Well nourished.  Vascular  Foot warm and well perfused. Capillary refill normal to all digits.   Neurologic Normal speech. Oriented to person, place, and time. Epicritic sensation to light touch grossly present bilaterally. Sensation intact left 2nd toe, diminshed left 3rd toe  Dermatologic Skin healed. Skin color normalized.  Orthopedic: No tenderness to palpation noted about the surgical sites.   Radiographs: XR taken and reviewed. Bridging without hardware failure noted. Assessment:   1. Capsulitis of metatarsophalangeal (MTP) joint of left foot   2. Deformity of metatarsal, left    Plan:  Patient was evaluated and treated and all questions answered.  S/p foot surgery left -Progressing as expected post-operatively. -XR: taken and reviewed. -Continue CMOs however if better tolerated without, patient can try without them. -Injection left 2nd interspace. 3rd toe still with numbness.   Procedure: Neuroma Injection Location: Left 2nd interspace Skin Prep: Alcohol. Injectate: 0.5 cc 0.5% marcaine plain, 0.5 cc dexamethasone phosphate. Disposition: Patient tolerated procedure well. Injection site dressed with a band-aid.   Return in about 2 months (around 07/21/2019) for Capsulitis f/u .

## 2019-07-23 ENCOUNTER — Ambulatory Visit: Payer: Managed Care, Other (non HMO) | Admitting: Podiatry

## 2019-07-23 ENCOUNTER — Other Ambulatory Visit: Payer: Self-pay

## 2019-07-23 DIAGNOSIS — G5763 Lesion of plantar nerve, bilateral lower limbs: Secondary | ICD-10-CM

## 2019-07-23 DIAGNOSIS — M7752 Other enthesopathy of left foot: Secondary | ICD-10-CM

## 2019-07-23 DIAGNOSIS — G588 Other specified mononeuropathies: Secondary | ICD-10-CM

## 2019-07-23 NOTE — Progress Notes (Signed)
  Subjective:  Patient ID: Martha Burgess, female    DOB: 03-Sep-1970,  MRN: [id number]  Chief Complaint  Patient presents with  . capsulitis    F/U Lt capsuitis and POV Pt. states," seems to be the same, I still don't have much feeling at my middle toe and numb all throug the inside of my foot." -pt denies swellgin Tx: inserts and OTC topical rub     49 y.o. female presents with the above complaint. History confirmed with patient. States the third toe is still numb. Does not bother her daily just she does not feel it. Doesn't really have pain. Using shoes and inserts.  Objective:  Physical Exam: warm, good capillary refill, no trophic changes or ulcerative lesions, normal DP and PT pulses and normal sensory exam. Left Foot: normal exam, no swelling, instability; ligaments intact, full range of motion of all ankle/foot joints. POP left 2nd interspace. Decreased sensation 2nd/3rd digits. Decreased sensation medial aspect of 1st metatarsal.  Assessment:   1. Neuroma digital nerve   2. Capsulitis of metatarsophalangeal (MTP) joint of left foot      Plan:  Patient was evaluated and treated and all questions answered.  Neuroma/Neuralgia -Injection delivered to the 2nd/3rd interspaces. -Discussed with patient that post-op neuralgia should subside with time. I do think some of the pain and nerve sensation is from the metatarsal heads being too close together causing a neuroma. Should pain persist consider MRI. -F/u should pain persist.  Procedure: Neuroma Injection Location: Left 2nd/3rd interspace Skin Prep: Alcohol. Injectate: 0.5 cc 0.5% marcaine plain, 0.5 cc dexamethasone phosphate, 0.5 cc Kenalog 40 Disposition: Patient tolerated procedure well. Injection site dressed with a band-aid.  Return if symptoms worsen or fail to improve.

## 2019-09-24 ENCOUNTER — Other Ambulatory Visit: Payer: Self-pay | Admitting: Podiatry

## 2019-09-24 ENCOUNTER — Other Ambulatory Visit: Payer: Self-pay

## 2019-09-24 ENCOUNTER — Ambulatory Visit (INDEPENDENT_AMBULATORY_CARE_PROVIDER_SITE_OTHER): Payer: Managed Care, Other (non HMO)

## 2019-09-24 ENCOUNTER — Ambulatory Visit: Payer: Managed Care, Other (non HMO) | Admitting: Podiatry

## 2019-09-24 DIAGNOSIS — Z9889 Other specified postprocedural states: Secondary | ICD-10-CM

## 2019-09-24 DIAGNOSIS — M7752 Other enthesopathy of left foot: Secondary | ICD-10-CM | POA: Diagnosis not present

## 2019-09-24 DIAGNOSIS — M216X2 Other acquired deformities of left foot: Secondary | ICD-10-CM | POA: Diagnosis not present

## 2019-09-24 DIAGNOSIS — M216X9 Other acquired deformities of unspecified foot: Secondary | ICD-10-CM

## 2019-09-24 NOTE — Progress Notes (Signed)
  Subjective:  Patient ID: Martha Burgess, female    DOB: Feb 04, 1971,  MRN: [id number]  Chief Complaint  Patient presents with  . foot lesion    lesion at Lt 3rd-4th sub mets x 3 wks; 10/10 sharp pains -constant pains -pt states it's started to get worse and worse and can hardly apply any pressure -w/ swellgin and redness after working all day Tx: custome inserts and trimming - w/ gait problems due to lesion growth    49 y.o. female presents with the above complaint. History confirmed with patient.   Objective:  Physical Exam: warm, good capillary refill, no trophic changes or ulcerative lesions, normal DP and PT pulses and normal sensory exam. Left Foot: HPK, tenderness 3rd/4th metatarsal, decreased ankle joint ROM. Fat pad atrophy noted.  Radiographs: X-ray of the left foot: no fracture, dislocation, swelling or degenerative changes noted. Surgical sites well healed. Good metatarsal parabola.  Assessment:   1. Capsulitis of metatarsophalangeal (MTP) joint of left foot   2. Equinus deformity of foot      Plan:  Patient was evaluated and treated and all questions answered.  Capsulitis -Educated on etiology -XR reviewed with patient -Discussed padding and proper shoegear -Offloading pad applied  -Lesion courtesy debrided  Equinus -Dispense night splint to stretch ankleoffload forefoot pressure  Return in about 1 month (around 10/25/2019) for Capsulitis.   MDM

## 2019-10-10 ENCOUNTER — Other Ambulatory Visit: Payer: Self-pay | Admitting: Podiatry

## 2019-10-10 DIAGNOSIS — M7752 Other enthesopathy of left foot: Secondary | ICD-10-CM

## 2019-10-29 ENCOUNTER — Ambulatory Visit: Payer: Managed Care, Other (non HMO) | Admitting: Podiatry

## 2019-11-12 ENCOUNTER — Other Ambulatory Visit: Payer: Self-pay

## 2019-11-12 ENCOUNTER — Encounter: Payer: Self-pay | Admitting: Podiatry

## 2019-11-12 ENCOUNTER — Ambulatory Visit: Payer: Managed Care, Other (non HMO) | Admitting: Podiatry

## 2019-11-12 DIAGNOSIS — M216X9 Other acquired deformities of unspecified foot: Secondary | ICD-10-CM

## 2019-11-12 DIAGNOSIS — M7752 Other enthesopathy of left foot: Secondary | ICD-10-CM

## 2019-11-12 NOTE — Progress Notes (Signed)
  Subjective:  Patient ID: Martha Burgess, female    DOB: [birth year],  MRN: [id number]  No chief complaint on file.  49 y.o. female presents for f/u. States the pain only went away for about a week. Doesn't think the orthotics are helping much.  Objective:  Physical Exam: warm, good capillary refill, no trophic changes or ulcerative lesions, normal DP and PT pulses and normal sensory exam. Left Foot: HPK, tenderness 3rd/4th metatarsal, decreased ankle joint ROM. Fat pad atrophy noted.  Radiographs: X-ray of the left foot: no fracture, dislocation, swelling or degenerative changes noted. Surgical sites well healed. Good metatarsal parabola.  Assessment:   1. Capsulitis of metatarsophalangeal (MTP) joint of left foot   2. Equinus deformity of foot      Plan:  Patient was evaluated and treated and all questions answered.  Capsulitis -Lesion courtesy debrided  -Offload patient's CMO -Will re-fabricate CMOs no charge - more rigid shell with offload for the metatarsal     Return in about 6 weeks (around 12/24/2019) for Capsulitis.   MDM

## 2019-12-24 ENCOUNTER — Ambulatory Visit: Payer: Managed Care, Other (non HMO) | Admitting: Podiatry

## 2019-12-24 ENCOUNTER — Other Ambulatory Visit: Payer: Self-pay

## 2019-12-24 DIAGNOSIS — M216X9 Other acquired deformities of unspecified foot: Secondary | ICD-10-CM | POA: Diagnosis not present

## 2019-12-24 DIAGNOSIS — M7752 Other enthesopathy of left foot: Secondary | ICD-10-CM | POA: Diagnosis not present

## 2019-12-24 NOTE — Progress Notes (Signed)
  Subjective:  Patient ID: Martha Burgess, female    DOB: Apr 08, 1971,  MRN: [id number]  Chief Complaint  Patient presents with  . capsulitis    F/U Lt capsuitis Pt. states," pain is aobut the same; 9/10   49 y.o. female presents for f/u. Still having pain with the callus worst while at work and on her feet for extended periods of time.  Objective:  Physical Exam: warm, good capillary refill, no trophic changes or ulcerative lesions, normal DP and PT pulses and normal sensory exam. Left Foot: HPK, tenderness 3rd/4th metatarsal, decreased ankle joint ROM. Fat pad atrophy noted.  Assessment:   1. Capsulitis of metatarsophalangeal (MTP) joint of left foot   2. Equinus deformity of foot      Plan:  Patient was evaluated and treated and all questions answered.  Capsulitis -Lesion courtesy debrided  -New CMOs dispensed. Pt pleased with fit. -F/u should issues persist.  No follow-ups on file.

## 2020-11-12 NOTE — Progress Notes (Deleted)
   New Patient Office Visit  Subjective:  Patient ID: Martha Burgess, female    DOB: 10/16/1970  Age: 50 y.o. MRN: [id number]  CC: No chief complaint on file.   HPI Martha Burgess presents for ***  No past medical history on file.  *** The histories are not reviewed yet. Please review them in the "History" navigator section and refresh this SmartLink.  No family history on file.  Social History   Socioeconomic History  . Marital status: Married    Spouse name: Not on file  . Number of children: Not on file  . Years of education: Not on file  . Highest education level: Not on file  Occupational History  . Not on file  Tobacco Use  . Smoking status: Current Every Day Smoker    Packs/day: 0.50    Types: Cigarettes  . Smokeless tobacco: Never Used  Substance and Sexual Activity  . Alcohol use: Not on file  . Drug use: Not on file  . Sexual activity: Not on file  Other Topics Concern  . Not on file  Social History Narrative  . Not on file   Social Determinants of Health   Financial Resource Strain: Not on file  Food Insecurity: Not on file  Transportation Needs: Not on file  Physical Activity: Not on file  Stress: Not on file  Social Connections: Not on file  Intimate Partner Violence: Not on file    ROS Review of Systems  Objective:   Today's Vitals: There were no vitals taken for this visit.  Physical Exam  Assessment & Plan:   Problem List Items Addressed This Visit   None     Outpatient Encounter Medications as of 11/13/2020  Medication Sig  . cephALEXin (KEFLEX) 500 MG capsule Take 1 capsule (500 mg total) by mouth 2 (two) times daily.  Marland Kitchen gabapentin (NEURONTIN) 300 MG capsule Take 1 capsule (300 mg total) by mouth at bedtime. After 1 week take twice daily. After another week take three times daily.  Marland Kitchen HYDROcodone-acetaminophen (NORCO) 10-325 MG tablet Take 1 tablet by mouth every 4 (four) hours as needed.  . ondansetron (ZOFRAN) 4 MG tablet Take  1 tablet (4 mg total) by mouth every 8 (eight) hours as needed for nausea or vomiting.  Marland Kitchen oxyCODONE-acetaminophen (PERCOCET) 10-325 MG tablet Take 1 tablet by mouth every 6 (six) hours as needed for pain.  . vitamin C (ASCORBIC ACID) 500 MG tablet Take 1 tablet (500 mg total) by mouth daily.   No facility-administered encounter medications on file as of 11/13/2020.    Follow-up: No follow-ups on file.   Janie Morning, NP

## 2020-11-13 ENCOUNTER — Ambulatory Visit: Payer: Managed Care, Other (non HMO) | Admitting: Nurse Practitioner

## 2020-11-13 DIAGNOSIS — Z0000 Encounter for general adult medical examination without abnormal findings: Secondary | ICD-10-CM

## 2020-11-13 DIAGNOSIS — Z7689 Persons encountering health services in other specified circumstances: Secondary | ICD-10-CM

## 2020-11-17 ENCOUNTER — Ambulatory Visit: Payer: Managed Care, Other (non HMO) | Admitting: Nurse Practitioner

## 2020-11-27 ENCOUNTER — Encounter: Payer: Self-pay | Admitting: Nurse Practitioner

## 2020-11-27 ENCOUNTER — Other Ambulatory Visit: Payer: Self-pay

## 2020-11-27 ENCOUNTER — Ambulatory Visit: Payer: Managed Care, Other (non HMO) | Admitting: Nurse Practitioner

## 2020-11-27 VITALS — BP 128/82 | HR 68 | Temp 97.9°F | Ht 67.0 in | Wt 171.0 lb

## 2020-11-27 DIAGNOSIS — M545 Low back pain, unspecified: Secondary | ICD-10-CM | POA: Diagnosis not present

## 2020-11-27 DIAGNOSIS — M7061 Trochanteric bursitis, right hip: Secondary | ICD-10-CM

## 2020-11-27 DIAGNOSIS — Z87442 Personal history of urinary calculi: Secondary | ICD-10-CM

## 2020-11-27 DIAGNOSIS — N921 Excessive and frequent menstruation with irregular cycle: Secondary | ICD-10-CM

## 2020-11-27 DIAGNOSIS — R319 Hematuria, unspecified: Secondary | ICD-10-CM

## 2020-11-27 DIAGNOSIS — Z1231 Encounter for screening mammogram for malignant neoplasm of breast: Secondary | ICD-10-CM

## 2020-11-27 DIAGNOSIS — J302 Other seasonal allergic rhinitis: Secondary | ICD-10-CM | POA: Diagnosis not present

## 2020-11-27 DIAGNOSIS — R5383 Other fatigue: Secondary | ICD-10-CM

## 2020-11-27 DIAGNOSIS — F17218 Nicotine dependence, cigarettes, with other nicotine-induced disorders: Secondary | ICD-10-CM

## 2020-11-27 DIAGNOSIS — Z7689 Persons encountering health services in other specified circumstances: Secondary | ICD-10-CM

## 2020-11-27 MED ORDER — MONTELUKAST SODIUM 10 MG PO TABS: 10.0000 mg | ORAL_TABLET | Freq: Every day | ORAL | 3 refills | Status: DC

## 2020-11-27 MED ORDER — TRIAMCINOLONE ACETONIDE 40 MG/ML IJ SUSP
60.0000 mg | Freq: Once | INTRAMUSCULAR | Status: AC
Start: 2020-11-27 — End: 2020-11-27
  Administered 2020-11-27: 60 mg via INTRAMUSCULAR

## 2020-11-27 MED ORDER — TRIAMCINOLONE ACETONIDE 40 MG/ML IJ SUSP: 60.0000 mg | Freq: Once | INTRAMUSCULAR | Status: DC

## 2020-11-27 MED ORDER — KETOROLAC TROMETHAMINE 60 MG/2ML IM SOLN
60.0000 mg | Freq: Once | INTRAMUSCULAR | Status: AC
  Administered 2020-11-27: 60 mg via INTRAMUSCULAR

## 2020-11-27 MED ORDER — KETOROLAC TROMETHAMINE 60 MG/2ML IM SOLN: 60.0000 mg | Freq: Once | INTRAMUSCULAR | Status: DC

## 2020-11-27 NOTE — Progress Notes (Signed)
New Patient Office Visit  Subjective:  Patient ID: Martha Burgess, female    DOB: [birth year]  Age: 50 y.o. MRN: [id number]  CC:  Chief Complaint  Patient presents with  . Allergies  . Fatigue  . Right hip pain    HPI Martha Burgess is a 50 year old Caucasian female that presents for evaluation of right hip pain, right lower back pain, and allergic rhinitis. This is her initial visit to the office. She has not had a screening mammogram, colonoscopy, routine eye or dental exam.   Right hip pain Martha Burgess tells me that she has been experiencing right hip pain for 3-4 months. Treatment has included Ibuprofen. Denies previous trauma, injury, or surgery to right hip. Denies falls.   Right lower back pain Martha Burgess states she has experienced right lower back pain that radiates down toward right hip and posterior leg for 3-4 months. Treatment has included Ibuprofen. She stands/walks on concrete floors several hours daily at her job. States she wears good supportive shoes that she rotates frequently. Denies previous injury to back, falling, or surgery to back. She tells me that she had an incidental finding of a right renal stone on a back CT in 2020. Denies previous referral to urology or knowledge of passing the stone.   Allergic rhinitis Martha Burgess tells me that she has experienced chronic allergic rhinitis for several years. States symptoms include rhinorrhea, post-nasal-drip, itchy watery eyes, and sinus congestion. Current treatment includes Zyrtec 10 mg daily. She has previously taken Allegra and Claritin. She was seen by an allergist in the past that administered allergy injections but stopped in 2020 due to COVID-19. She has requested a referral to an allergist to manage symptoms.   Menorrhagia with irregular menstrual cycles Martha Burgess states she is experiencing perimenopausal symptoms of weight gain and irregular menstrual cycles. States she will have a cycle every 2-3 months that are heavy that last for  several days.    No family history on file.  Social History   Socioeconomic History  . Marital status: Married    Spouse name: Not on file  . Number of children: Not on file  . Years of education: Not on file  . Highest education level: Not on file  Occupational History  . Not on file  Tobacco Use  . Smoking status: Current Every Day Smoker    Packs/day: 0.50    Types: Cigarettes  . Smokeless tobacco: Never Used  Substance and Sexual Activity  . Alcohol use: Not on file  . Drug use: Not on file  . Sexual activity: Not on file  Other Topics Concern  . Not on file  Social History Narrative  . Not on file    ROS Review of Systems  Constitutional: Positive for unexpected weight change (weight gain). Negative for appetite change and fatigue.  HENT: Negative for congestion, ear pain, rhinorrhea, sinus pressure, sinus pain and tinnitus.   Eyes: Negative for pain.  Respiratory: Negative for cough and shortness of breath.   Cardiovascular: Negative for chest pain, palpitations and leg swelling.  Gastrointestinal: Negative for abdominal pain, constipation, diarrhea, nausea and vomiting.  Endocrine: Negative for cold intolerance, heat intolerance, polydipsia, polyphagia and polyuria.  Genitourinary: Positive for menstrual problem. Negative for dysuria, frequency and hematuria.  Musculoskeletal: Positive for arthralgias, back pain and gait problem. Negative for joint swelling and myalgias.  Skin: Negative for rash.  Allergic/Immunologic: Positive for environmental allergies.  Neurological: Negative for dizziness and headaches.  Hematological: Negative for adenopathy.  Psychiatric/Behavioral: Negative  for decreased concentration and sleep disturbance. The patient is not nervous/anxious.     Objective:   Today's Vitals: BP 128/82 (BP Location: Left Arm, Patient Position: Sitting)   Pulse 68   Temp 97.9 F (36.6 C) (Temporal)   Ht 5\' 7"  (1.702 m)   Wt 171 lb (77.6 kg)   SpO2  98%   BMI 26.78 kg/m   Physical Exam Vitals reviewed.  Constitutional:      Appearance: Normal appearance.  HENT:     Head: Normocephalic.     Right Ear: Tympanic membrane normal.     Left Ear: Tympanic membrane normal.     Nose: Nose normal.     Mouth/Throat:     Mouth: Mucous membranes are moist.  Eyes:     Pupils: Pupils are equal, round, and reactive to light.  Cardiovascular:     Rate and Rhythm: Normal rate and regular rhythm.     Pulses: Normal pulses.     Heart sounds: Normal heart sounds.  Pulmonary:     Effort: Pulmonary effort is normal.     Breath sounds: Normal breath sounds.  Abdominal:     General: Bowel sounds are normal.     Palpations: Abdomen is soft.  Musculoskeletal:     Cervical back: Neck supple.     Lumbar back: Tenderness (right lower lumbar area toward right hip) present. Decreased range of motion.     Right hip: Tenderness and bony tenderness present.     Left hip: Normal.  Skin:    General: Skin is warm and dry.     Capillary Refill: Capillary refill takes less than 2 seconds.  Neurological:     General: No focal deficit present.     Mental Status: She is alert and oriented to person, place, and time.  Psychiatric:        Mood and Affect: Mood normal.        Behavior: Behavior normal.     Assessment & Plan:   1. Lumbar pain - DG Abd 1 View - DG Lumbar Spine Complete - ketorolac (TORADOL) injection 60 mg - triamcinolone acetonide (KENALOG-40) injection 60 mg  2. Trochanteric bursitis of right hip - ketorolac (TORADOL) injection 60 mg - triamcinolone acetonide (KENALOG-40) injection 60 mg  3. Menorrhagia with irregular cycle - CBC with Differential/Platelet - TSH  4. Hematuria, unspecified type - DG Abd 1 View  5. Seasonal allergies - montelukast (SINGULAIR) 10 MG tablet; Take 1 tablet (10 mg total) by mouth at bedtime.  Dispense: 30 tablet; Refill: 3 - Ambulatory referral to Allergy - triamcinolone acetonide (KENALOG-40)  injection 60 mg  6. History of renal stone - DG Abd 1 View - POCT URINALYSIS DIP (CLINITEK)  7. Other fatigue - CBC with Differential/Platelet - Comprehensive metabolic panel - TSH - VITAMIN D 25 Hydroxy (Vit-D Deficiency, Fractures)  8. Encounter for screening mammogram for malignant neoplasm of breast - MM DIGITAL SCREENING BILATERAL  9. Cigarette nicotine dependence with other nicotine-induced disorder -smoking cessation encouraged  10. Encounter to establish care with new doctor    Outpatient Encounter Medications as of 11/27/2020  Medication Sig  . cetirizine (ZYRTEC) 10 MG tablet Take 10 mg by mouth daily.  . [DISCONTINUED] vitamin C (ASCORBIC ACID) 500 MG tablet Take 1 tablet (500 mg total) by mouth daily.   No facility-administered encounter medications on file as of 11/27/2020.    Recommend dental and eye exam Begin Singulair 10 mg  We will call you with lab results,  allergist referral, and time for mammogram (Monday,August 29th, will have lipid panel drawn) Remove laundry basket from bedroom Consider smoking cessation Kenalog 60 mg and Toradol 60 mg injection given for right hip pain We will call you with x-ray results Follow-up 4-weeks    Follow-up: 4-weeks   Signed, Janie Morning, NP

## 2020-11-27 NOTE — Patient Instructions (Addendum)
Recommend dental and eye exam Begin Singulair 10 mg  We will call you with lab results, allergist referral, and time for mammogram (Monday,August 29th, will have lipid panel drawn) Remove laundry basket from bedroom Consider smoking cessation Kenalog 60 mg and Toradol 60 mg injection given for right hip pain We will call you with x-ray results Follow-up 4-weeks   Chronic Back Pain When back pain lasts longer than 3 months, it is called chronic back pain. Pain may get worse at certain times (flare-ups). There are things you can do at home to manage your pain. Follow these instructions at home: Pay attention to any changes in your symptoms. Take these actions to help with your pain: Managing pain and stiffness  If told, put ice on the painful area. Your doctor may tell you to use ice for 24-48 hours after the flare-up starts. To do this: ? Put ice in a plastic bag. ? Place a towel between your skin and the bag. ? Leave the ice on for 20 minutes, 2-3 times a day.  If told, put heat on the painful area. Do this as often as told by your doctor. Use the heat source that your doctor recommends, such as a moist heat pack or a heating pad. ? Place a towel between your skin and the heat source. ? Leave the heat on for 20-30 minutes. ? Take off the heat if your skin turns bright red. This is especially important if you are unable to feel pain, heat, or cold. You may have a greater risk of getting burned.  Soak in a warm bath. This can help relieve pain.      Activity  Avoid bending and other activities that make pain worse.  When standing: ? Keep your upper back and neck straight. ? Keep your shoulders pulled back. ? Avoid slouching.  When sitting: ? Keep your back straight. ? Relax your shoulders. Do not round your shoulders or pull them backward.  Do not sit or stand in one place for long periods of time.  Take short rest breaks during the day. Lying down or standing is usually  better than sitting. Resting can help relieve pain.  When sitting or lying down for a long time, do some mild activity or stretching. This will help to prevent stiffness and pain.  Get regular exercise. Ask your doctor what activities are safe for you.  Do not lift anything that is heavier than 10 lb (4.5 kg) or the limit that you are told, until your doctor says that it is safe.  To prevent injury when you lift things: ? Bend your knees. ? Keep the weight close to your body. ? Avoid twisting.  Sleep on a firm mattress. Try lying on your side with your knees slightly bent. If you lie on your back, put a pillow under your knees.   Medicines  Treatment may include medicines for pain and swelling taken by mouth or put on the skin, prescription pain medicine, or muscle relaxants.  Take over-the-counter and prescription medicines only as told by your doctor.  Ask your doctor if the medicine prescribed to you: ? Requires you to avoid driving or using machinery. ? Can cause trouble pooping (constipation). You may need to take these actions to prevent or treat trouble pooping:  Drink enough fluid to keep your pee (urine) pale yellow.  Take over-the-counter or prescription medicines.  Eat foods that are high in fiber. These include beans, whole grains, and fresh fruits and  vegetables.  Limit foods that are high in fat and sugars. These include fried or sweet foods. General instructions  Do not use any products that contain nicotine or tobacco, such as cigarettes, e-cigarettes, and chewing tobacco. If you need help quitting, ask your doctor.  Keep all follow-up visits as told by your doctor. This is important. Contact a doctor if:  Your pain does not get better with rest or medicine.  Your pain gets worse, or you have new pain.  You have a high fever.  You lose weight very quickly.  You have trouble doing your normal activities. Get help right away if:  One or both of your legs  or feet feel weak.  One or both of your legs or feet lose feeling (have numbness).  You have trouble controlling when you poop (have a bowel movement) or pee (urinate).  You have bad back pain and: ? You feel like you may vomit (nauseous), or you vomit. ? You have pain in your belly (abdomen). ? You have shortness of breath. ? You faint. Summary  When back pain lasts longer than 3 months, it is called chronic back pain.  Pain may get worse at certain times (flare-ups).  Use ice and heat as told by your doctor. Your doctor may tell you to use ice after flare-ups. This information is not intended to replace advice given to you by your health care provider. Make sure you discuss any questions you have with your health care provider. Document Revised: 08/15/2019 Document Reviewed: 08/15/2019 Elsevier Patient Education  2021 [employer]  Mammogram A mammogram is an X-ray of the breasts. This is done to check for changes that are not normal. This test can look for changes that may be caused by breast cancer or other problems. Mammograms are regularly done on women beginning at age 21. A man may have a mammogram if he has a lump or swelling in his breast. Tell a doctor:  About any allergies you have.  If you have breast implants.  If you have had breast disease, biopsy, or surgery.  If you have a family history of breast cancer.  If you are breastfeeding.  Whether you are pregnant or may be pregnant. What are the risks? Generally, this is a safe procedure. But problems may occur, including:  Being exposed to radiation. Radiation levels are very low with this test.  The need for more tests.  The results were not read properly.  Trouble finding breast cancer in women with dense breasts. What happens before the test?  Have this test done about 1-2 weeks after your menstrual period. This is often when your breasts are the least tender.  If you are visiting a new doctor or  clinic, have any past mammogram images sent to your new doctor's office.  Wash your breasts and under your arms on the day of the test.  Do not use deodorants, perfumes, lotions, or powders on the day of the test.  Take off any jewelry from your neck.  Wear clothes that you can change into and out of easily. What happens during the test?  You will take off your clothes from the waist up. You will put on a gown.  You will stand in front of the X-ray machine.  Each breast will be placed between two plastic or glass plates. The plates will press down on your breast for a few seconds. Try to relax. This does not cause any harm to your breasts. It  may not feel comfortable, but it will be very brief.  X-rays will be taken from different angles of each breast. The procedure may vary among doctors and hospitals.   What can I expect after the test?  The mammogram will be read by a specialist (radiologist).  You may need to do parts of the test again. This depends on the quality of the images.  You may go back to your normal activities.  It is up to you to get the results of your test. Ask how to get your results when they are ready. Summary  A mammogram is an X-ray of the breasts. It looks for changes that may be caused by breast cancer or other problems.  A man may have this test if he has a lump or swelling in his breast.  Before the test, tell your doctor about any breast problems that you have had in the past.  Have this test done about 1-2 weeks after your menstrual period.  Ask when your test results will be ready. Make sure you get your test results. This information is not intended to replace advice given to you by your health care provider. Make sure you discuss any questions you have with your health care provider. Document Revised: 05/05/2020 Document Reviewed: 05/05/2020 Elsevier Patient Education  2021 [employer] Allergic Rhinitis, Adult Allergic rhinitis is a  reaction to allergens. Allergens are things that can cause an allergic reaction. This condition affects the lining inside the nose (mucous membrane). There are two types of allergic rhinitis:  Seasonal. This type is also called hay fever. It happens only during some times of the year.  Perennial. This type can happen at any time of the year. This condition cannot be spread from person to person (is not contagious). It can be mild, worse, or very bad. It can develop at any age and may be outgrown. What are the causes? This condition may be caused by:  Pollen from grasses, trees, and weeds.  Dust mites.  Smoke.  Mold.  Car fumes.  The pee (urine), spit, or dander of pets. Dander is dead skin cells from a pet.   What increases the risk? You are more likely to develop this condition if:  You have allergies in your family.  You have problems like allergies in your family. You may have: ? Swelling of parts of your eyes and eyelids. ? Asthma. This affects how you breathe. ? Long-term redness and swelling on your skin. ? Food allergies. What are the signs or symptoms? The main symptom of this condition is a runny or stuffy nose (nasal congestion). Other symptoms may include:  Sneezing or coughing.  Itching and tearing of your eyes.  Mucus that drips down the back of your throat (postnasal drip).  Trouble sleeping.  Feeling tired.  Headache.  Sore throat. How is this treated? There is no cure for this condition. You should avoid things that you are allergic to. Treatment can help to relieve symptoms. This may include:  Medicines that block allergy symptoms, such as corticosteroids or antihistamines. These may be given as a shot, nasal spray, or pill.  Avoiding things you are allergic to.  Medicines that give you bits of what you are allergic to over time. This is called immunotherapy. It is done if other treatments do not help. You may get: ? Shots. ? Medicine under  your tongue.  Stronger medicines, if other treatments do not help. Follow these instructions at home: Avoiding allergens  Find out what things you are allergic to and avoid them. To do this, try these things:  If you get allergies any time of year: ? Replace carpet with wood, tile, or vinyl flooring. Carpet can trap pet dander and dust. ? Do not smoke. Do not allow smoking in your home. ? Change your heating and air conditioning filters at least once a month.  If you get allergies only some times of the year: ? Keep windows closed when you can. ? Plan things to do outside when pollen counts are lowest. Check pollen counts before you plan things to do outside. ? When you come indoors, change your clothes and shower before you sit on furniture or bedding.   If you are allergic to a pet: ? Keep the pet out of your bedroom. ? Vacuum, sweep, and dust often.   General instructions  Take over-the-counter and prescription medicines only as told by your doctor.  Drink enough fluid to keep your pee (urine) pale yellow.  Keep all follow-up visits as told by your doctor. This is important. Where to find more information  American Academy of Allergy, Asthma & Immunology: www.aaaai.org Contact a doctor if:  You have a fever.  You get a cough that does not go away.  You make whistling sounds when you breathe (wheeze).  Your symptoms slow you down.  Your symptoms stop you from doing your normal things each day. Get help right away if:  You are short of breath. This symptom may be an emergency. Do not wait to see if the symptom will go away. Get medical help right away. Call your local emergency services (911 in the [place] Do not drive yourself to the hospital. Summary  Allergic rhinitis may be treated by taking medicines and avoiding things you are allergic to.  If you have allergies only some of the year, keep windows closed when you can at those times.  Contact your doctor if you  get a fever or a cough that does not go away. This information is not intended to replace advice given to you by your health care provider. Make sure you discuss any questions you have with your health care provider. Document Revised: 08/27/2019 Document Reviewed: 07/03/2019 Elsevier Patient Education  2021 [employer].   Hip Bursitis  Hip bursitis is swelling of one or more fluid-filled sacs (bursae) in your hip joint. This condition can cause pain, and your symptoms may come and go over time. What are the causes?  Repeated use of your hip muscles.  Injury to the hip.  Weak butt muscles.  Bone spurs.  Infection. In some cases, the cause may not be known. What increases the risk? You are more likely to develop this condition if:  You had a past hip injury or hip surgery.  You have a condition, such as arthritis, gout, diabetes, or thyroid disease.  You have spine problems.  You have one leg that is shorter than the other.  You run a lot or do long-distance running.  You play sports where there is a risk of injury or falling, such as football, martial arts, or skiing. What are the signs or symptoms? Symptoms may come and go, and they often include:  Pain in the hip or groin area. Pain may get worse when you move your hip.  Tenderness and swelling of the hip. In rare cases, the bursa may become infected. If this happens, you may get a fever, as well as have warmth and  redness in the hip area. How is this treated? This condition is treated by:  Resting your hip.  Icing your hip.  Wrapping the hip area with an elastic bandage (compression wrap).  Keeping the hip raised. Other treatments may include medicine, draining fluid out of the bursa, or using crutches, a cane, or a walker. Surgery may be needed, but this is rare. Long-term treatment may include doing exercises to help your strength and flexibility. It may also include lifestyle changes like losing weight to  lessen the strain on your hip. Follow these instructions at home: Managing pain, stiffness, and swelling  If told, put ice on the painful area. ? Put ice in a plastic bag. ? Place a towel between your skin and the bag. ? Leave the ice on for 20 minutes, 2-3 times a day.  Raise your hip by putting a pillow under your hips while you lie down. Stop if you feel pain.  If told, put heat on the affected area. Do this as often as told by your doctor. Use a moist heat pack or a heating pad as told by your doctor. ? Place a towel between your skin and the heat source. ? Leave the heat on for 20-30 minutes. ? Take off the heat if your skin turns bright red. This is very important if you are unable to feel pain, heat, or cold. You may have a greater risk of getting burned.      Activity  Do not use your hip to support your body weight until your doctor says that you can.  Use crutches, a cane, or a walker as told by your doctor.  If the affected leg is one that you use to drive, ask your doctor if it is safe to drive.  Rest and protect your hip as much as you can until you feel better.  Return to your normal activities as told by your doctor. Ask your doctor what activities are safe for you.  Do exercises as told by your doctor. General instructions  Take over-the-counter and prescription medicines only as told by your doctor.  Gently rub and stretch your injured area as often as is comfortable.  Wear elastic bandages only as told by your doctor.  If one of your legs is shorter than the other, get fitted for a shoe insert or orthotic.  Keep a healthy weight. Follow instructions from your doctor.  Keep all follow-up visits as told by your doctor. This is important. How is this prevented?  Exercise regularly, as told by your doctor.  Wear the right shoes for the sport you play.  Warm up and stretch before being active. Cool down and stretch after being active.  Take breaks often  from repeated activity.  Avoid activities that bother your hip or cause pain.  Avoid sitting down for a long time. Where to find more information  American Academy [employer] Orthopa[employer]geons: orthoinfo.aaos.org Contact a doctor if:  You have a fever.  You have new symptoms.  You have trouble walking or doing everyday activities.  You have pain that gets worse or does not get better with medicine.  Your skin around your hip is red.  You get a feeling of warmth in your hip area. Get help right away if:  You cannot move your hip.  You have very bad pain.  You cannot control the muscles in your feet. Summary  Hip bursitis is swelling of one or more fluid-filled sacs (bursae) in your hip  joint.  Symptoms often come and go over time.  This condition is often treated by resting and icing the hip. It also may help to keep the area raised and wrapped in an elastic bandage. Other treatments may be needed. This information is not intended to replace advice given to you by your health care provider. Make sure you discuss any questions you have with your health care provider. Document Revised: 05/07/2019 Document Reviewed: 03/13/2018 Elsevier Patient Education  2021 [employer].

## 2020-11-28 ENCOUNTER — Other Ambulatory Visit: Payer: Self-pay

## 2020-11-28 LAB — CBC WITH DIFFERENTIAL/PLATELET
Basophils Absolute: 0.1 10*3/uL (ref 0.0–0.2)
Basos: 1 %
EOS (ABSOLUTE): 0.2 10*3/uL (ref 0.0–0.4)
Eos: 3 %
Hematocrit: 44.7 % (ref 34.0–46.6)
Hemoglobin: 14.9 g/dL (ref 11.1–15.9)
Immature Grans (Abs): 0 10*3/uL (ref 0.0–0.1)
Immature Granulocytes: 0 %
Lymphocytes Absolute: 2.4 10*3/uL (ref 0.7–3.1)
Lymphs: 28 %
MCH: 29.4 pg (ref 26.6–33.0)
MCHC: 33.3 g/dL (ref 31.5–35.7)
MCV: 88 fL (ref 79–97)
Monocytes Absolute: 0.5 10*3/uL (ref 0.1–0.9)
Monocytes: 6 %
Neutrophils Absolute: 5.2 10*3/uL (ref 1.4–7.0)
Neutrophils: 62 %
Platelets: 293 10*3/uL (ref 150–450)
RBC: 5.07 x10E6/uL (ref 3.77–5.28)
RDW: 13.2 % (ref 11.7–15.4)
WBC: 8.4 10*3/uL (ref 3.4–10.8)

## 2020-11-28 LAB — COMPREHENSIVE METABOLIC PANEL
ALT: 16 IU/L (ref 0–32)
AST: 14 IU/L (ref 0–40)
Albumin/Globulin Ratio: 2 (ref 1.2–2.2)
Albumin: 4.8 g/dL (ref 3.8–4.8)
Alkaline Phosphatase: 92 IU/L (ref 44–121)
BUN/Creatinine Ratio: 10 (ref 9–23)
BUN: 10 mg/dL (ref 6–24)
Bilirubin Total: 0.3 mg/dL (ref 0.0–1.2)
CO2: 23 mmol/L (ref 20–29)
Calcium: 10.2 mg/dL (ref 8.7–10.2)
Chloride: 101 mmol/L (ref 96–106)
Creatinine, Ser: 0.98 mg/dL (ref 0.57–1.00)
Globulin, Total: 2.4 g/dL (ref 1.5–4.5)
Glucose: 86 mg/dL (ref 65–99)
Potassium: 4.1 mmol/L (ref 3.5–5.2)
Sodium: 140 mmol/L (ref 134–144)
Total Protein: 7.2 g/dL (ref 6.0–8.5)
eGFR: 71 mL/min/{1.73_m2} (ref >59)

## 2020-11-28 LAB — POCT URINALYSIS DIP (CLINITEK)
Bilirubin, UA: NEGATIVE
Glucose, UA: NEGATIVE mg/dL
Ketones, POC UA: NEGATIVE mg/dL
Leukocytes, UA: NEGATIVE
Nitrite, UA: NEGATIVE
POC PROTEIN,UA: NEGATIVE
Spec Grav, UA: 1.02 (ref 1.010–1.025)
Urobilinogen, UA: NEGATIVE E.U./dL — AB
pH, UA: 6 (ref 5.0–8.0)

## 2020-11-28 LAB — VITAMIN D 25 HYDROXY (VIT D DEFICIENCY, FRACTURES): Vit D, 25-Hydroxy: 21.5 ng/mL — ABNORMAL LOW (ref 30.0–100.0)

## 2020-11-28 LAB — TSH: TSH: 2.16 u[IU]/mL (ref 0.450–4.500)

## 2020-11-28 MED ORDER — VITAMIN D (ERGOCALCIFEROL) 1.25 MG (50000 UNIT) PO CAPS: 50000.0000 [IU] | ORAL_CAPSULE | ORAL | 0 refills | Status: DC

## 2020-12-09 ENCOUNTER — Other Ambulatory Visit: Payer: Self-pay | Admitting: Nurse Practitioner

## 2020-12-09 DIAGNOSIS — N200 Calculus of kidney: Secondary | ICD-10-CM

## 2020-12-25 ENCOUNTER — Encounter: Payer: Self-pay | Admitting: Nurse Practitioner

## 2020-12-25 ENCOUNTER — Ambulatory Visit: Payer: Managed Care, Other (non HMO) | Admitting: Nurse Practitioner

## 2020-12-25 ENCOUNTER — Other Ambulatory Visit: Payer: Self-pay

## 2020-12-25 VITALS — BP 122/68 | HR 74 | Temp 97.4°F | Ht 67.0 in | Wt 174.0 lb

## 2020-12-25 DIAGNOSIS — R635 Abnormal weight gain: Secondary | ICD-10-CM

## 2020-12-25 DIAGNOSIS — E559 Vitamin D deficiency, unspecified: Secondary | ICD-10-CM

## 2020-12-25 DIAGNOSIS — R252 Cramp and spasm: Secondary | ICD-10-CM

## 2020-12-25 DIAGNOSIS — N200 Calculus of kidney: Secondary | ICD-10-CM

## 2020-12-25 DIAGNOSIS — J302 Other seasonal allergic rhinitis: Secondary | ICD-10-CM | POA: Diagnosis not present

## 2020-12-25 DIAGNOSIS — N939 Abnormal uterine and vaginal bleeding, unspecified: Secondary | ICD-10-CM

## 2020-12-25 MED ORDER — FOLIC ACID-VIT B6-VIT B12 0.5-5-0.2 MG PO TABS
1.0000 | ORAL_TABLET | Freq: Every day | ORAL | 2 refills | Status: AC
Start: 2020-12-25 — End: ?

## 2020-12-25 MED ORDER — NORETHINDRONE ACETATE 5 MG PO TABS: 10.0000 mg | ORAL_TABLET | Freq: Every day | ORAL | 0 refills | Status: DC

## 2020-12-25 NOTE — Patient Instructions (Addendum)
We will call you with lab results Keep appt with urologist and allergist as scheduled Perform leg stretches before going to bed Take B12 complex to help prevent leg cramps Fasting lipid panel and mammogram on August 29th Take Aygestin 10 mg once daily for uterine bleeding Follow-up in 79-months Leg Cramps Leg cramps occur when one or more muscles tighten and a person has no control over it (involuntary muscle contraction). Muscle cramps are most common in the calf muscles of the leg. They can occur during exercise or at rest. Leg cramps are painful, and they may last for a few seconds to a few minutes. Cramps may return several times before they finallystop. Usually, leg cramps are not caused by a serious medical problem. In many cases, the cause is not known. Some common causes include: Excessive physical effort (overexertion), such as during intense exercise. Doing the same motion over and over. Staying in a certain position for a long period of time. Improper preparation, form, or technique while doing a sport or an activity. Dehydration. Injury. Side effects of certain medicines. Abnormally low levels of minerals in your blood (electrolytes), especially potassium and calcium. This could result from: Pregnancy. Taking diuretic medicines. Follow these instructions at home: Eating and drinking Drink enough fluid to keep your urine pale yellow. Staying hydrated may help prevent cramps. Eat a healthy diet that includes plenty of nutrients to help your muscles function. A healthy diet includes fruits and vegetables, lean protein, whole grains, and low-fat or nonfat dairy products. Managing pain, stiffness, and swelling     Try massaging, stretching, and relaxing the affected muscle. Do this for several minutes at a time. If directed, put ice on areas that are sore or painful after a cramp. To do this: Put ice in a plastic bag. Place a towel between your skin and the bag. Leave the ice on  for 20 minutes, 2-3 times a day. Remove the ice if your skin turns bright red. This is very important. If you cannot feel pain, heat, or cold, you have a greater risk of damage to the area. If directed, apply heat to muscles that are tense or tight. Do this before you exercise, or as often as told by your health care provider. Use the heat source that your health care provider recommends, such as a moist heat pack or a heating pad. To do this: Place a towel between your skin and the heat source. Leave the heat on for 20-30 minutes. Remove the heat if your skin turns bright red. This is especially important if you are unable to feel pain, heat, or cold. You may have a greater risk of getting burned. Try taking hot showers or baths to help relax tight muscles. General instructions If you are having frequent leg cramps, avoid intense exercise for several days. Take over-the-counter and prescription medicines only as told by your health care provider. Keep all follow-up visits. This is important. Contact a health care provider if: Your leg cramps get more severe or more frequent, or they do not improve over time. Your foot becomes cold, numb, or blue. Summary Muscle cramps can develop in any muscle, but the most common place is in the calf muscles of the leg. Leg cramps are painful, and they may last for a few seconds to a few minutes. Usually, leg cramps are not caused by a serious medical problem. Often, the cause is not known. Stay hydrated, and take over-the-counter and prescription medicines only as told by your  health care provider. This information is not intended to replace advice given to you by your health care provider. Make sure you discuss any questions you have with your healthcare provider. Document Revised: 11/21/2019 Document Reviewed: 11/21/2019 Elsevier Patient Education  2022 Elsevier Inc.  Textbook of Natural Medicine (5th ed., pp. [phone]). [place], [place]: Elsevier.">   Dietary Guidelines to Help Prevent Kidney Stones Kidney stones are deposits of minerals and salts that form inside your kidneys. Your risk of developing kidney stones may be greater depending on your diet, your lifestyle, the medicines you take, and whether you have certain medical conditions. Most people can lower their chances of developing kidney stones by following the instructions below. Your dietitian may give you more specific instructions depending on your overall health and the type of kidney stones you tend to develop. What are tips for following this plan? Reading food labels Choose foods with "no salt added" or "low-salt" labels. Limit your salt (sodium) intake to less than 1,500 mg a day. Choose foods with calcium for each meal and snack. Try to eat about 300 mg of calcium at each meal. Foods that contain 200-500 mg of calcium a serving include: 8 oz (237 mL) of milk, calcium-fortifiednon-dairy milk, and calcium-fortifiedfruit juice. Calcium-fortified means that calcium has been added to these drinks. 8 oz (237 mL) of kefir, yogurt, and soy yogurt. 4 oz (114 g) of tofu. 1 oz (28 g) of cheese. 1 cup (150 g) of dried figs. 1 cup (91 g) of cooked broccoli. One 3 oz (85 g) can of sardines or mackerel. Most people need 1,000-1,500 mg of calcium a day. Talk to your dietitian about how much calcium is recommended for you.   Shopping Buy plenty of fresh fruits and vegetables. Most people do not need to avoid fruits and vegetables, even if these foods contain nutrients that may contribute to kidney stones. When shopping for convenience foods, choose: Whole pieces of fruit. Pre-made salads with dressing on the side. Low-fat fruit and yogurt smoothies. Avoid buying frozen meals or prepared deli foods. These can be high in sodium. Look for foods with live cultures, such as yogurt and kefir. Choose high-fiber grains, such as whole-wheat breads, oat bran, and wheat cereals. Cooking Do not  add salt to food when cooking. Place a salt shaker on the table and allow each person to add his or her own salt to taste. Use vegetable protein, such as beans, textured vegetable protein (TVP), or tofu, instead of meat in pasta, casseroles, and soups. Meal planning Eat less salt, if told by your dietitian. To do this: Avoid eating processed or pre-made food. Avoid eating fast food. Eat less animal protein, including cheese, meat, poultry, or fish, if told by your dietitian. To do this: Limit the number of times you have meat, poultry, fish, or cheese each week. Eat a diet free of meat at least 2 days a week. Eat only one serving each day of meat, poultry, fish, or seafood. When you prepare animal protein, cut pieces into small portion sizes. For most meat and fish, one serving is about the size of the palm of your hand. Eat at least five servings of fresh fruits and vegetables each day. To do this: Keep fruits and vegetables on hand for snacks. Eat one piece of fruit or a handful of berries with breakfast. Have a salad and fruit at lunch. Have two kinds of vegetables at dinner. Limit foods that are high in a substance called oxalate. These  include: Spinach (cooked), rhubarb, beets, sweet potatoes, and Swiss chard. Peanuts. Potato chips, french fries, and baked potatoes with skin on. Nuts and nut products. Chocolate. If you regularly take a diuretic medicine, make sure to eat at least 1 or 2 servings of fruits or vegetables that are high in potassium each day. These include: Avocado. Banana. Orange, prune, carrot, or tomato juice. Baked potato. Cabbage. Beans and split peas. Lifestyle Drink enough fluid to keep your urine pale yellow. This is the most important thing you can do. Spread your fluid intake throughout the day. If you drink alcohol: Limit how much you use to: 0-1 drink a day for women who are not pregnant. 0-2 drinks a day for men. Be aware of how much alcohol is in your  drink. In the [place] one drink equals one 12 oz bottle of beer (355 mL), one 5 oz glass of wine (148 mL), or one 1 oz glass of hard liquor (44 mL). Lose weight if told by your health care provider. Work with your dietitian to find an eating plan and weight loss strategies that work best for you.   General information Talk to your health care provider and dietitian about taking daily supplements. You may be told the following depending on your health and the cause of your kidney stones: Not to take supplements with vitamin C. To take a calcium supplement. To take a daily probiotic supplement. To take other supplements such as magnesium, fish oil, or vitamin B6. Take over-the-counter and prescription medicines only as told by your health care provider. These include supplements. What foods should I limit? Limit your intake of the following foods, or eat them as told by your dietitian. Vegetables Spinach. Rhubarb. Beets. Canned vegetables. Rosita FirePickles. Olives. Baked potatoes with skin. Grains Wheat bran. Baked goods. Salted crackers. Cereals high in sugar. Meats and other proteins Nuts. Nut butters. Large portions of meat, poultry, or fish. Salted, precooked, or cured meats, such as sausages, meat loaves, and hot dogs. Dairy Cheese. Beverages Regular soft drinks. Regular vegetable juice. Seasonings and condiments Seasoning blends with salt. Salad dressings. Soy sauce. Ketchup. Barbecue sauce. Other foods Canned soups. Canned pasta sauce. Casseroles. Pizza. Lasagna. Frozen meals. Potato chips. [place] fries. The items listed above may not be a complete list of foods and beverages you should limit. Contact a dietitian for more information. What foods should I avoid? Talk to your dietitian about specific foods you should avoid based on the type of kidney stones you have and your overall health. Fruits Grapefruit. The item listed above may not be a complete list of foods and beverages you should  avoid. Contact a dietitian for more information. Summary Kidney stones are deposits of minerals and salts that form inside your kidneys. You can lower your risk of kidney stones by making changes to your diet. The most important thing you can do is drink enough fluid. Drink enough fluid to keep your urine pale yellow. Talk to your dietitian about how much calcium you should have each day, and eat less salt and animal protein as told by your dietitian. This information is not intended to replace advice given to you by your health care provider. Make sure you discuss any questions you have with your health care provider. Document Revised: 06/28/2019 Document Reviewed: 06/28/2019 Elsevier Patient Education  2021 Elsevier Inc. Kidney Stones Kidney stones are rock-like masses that form inside of the kidneys. Kidneys are organs that make pee (urine). A kidney stone may move into other  parts of the urinary tract, including: The tubes that connect the kidneys to the bladder (ureters). The bladder. The tube that carries urine out of the body (urethra). Kidney stones can cause very bad pain and can block the flow of pee. The stone usually leaves your body (passes) through your pee. You may need to have a doctor take out the stone. What are the causes? Kidney stones may be caused by: A condition in which certain glands make too much parathyroid hormone (primary hyperparathyroidism). A buildup of a type of crystals in the bladder made of a chemical called uric acid. The body makes uric acid when you eat certain foods. Narrowing (stricture) of one or both of the ureters. A kidney blockage that you were born with. Past surgery on the kidney or the ureters, such as gastric bypass surgery. What increases the risk? You are more likely to develop this condition if: You have had a kidney stone in the past. You have a family history of kidney stones. You do not drink enough water. You eat a diet that is high  in protein, salt (sodium), or sugar. You are overweight or very overweight (obese). What are the signs or symptoms? Symptoms of a kidney stone may include: Pain in the side of the belly, right below the ribs (flank pain). Pain usually spreads (radiates) to the groin. Needing to pee often or right away (urgently). Pain when going pee (urinating). Blood in your pee (hematuria). Feeling like you may vomit (nauseous). Vomiting. Fever and chills. How is this treated? Treatment depends on the size, location, and makeup of the kidney stones. The stones will often pass out of the body through peeing. You may need to: Drink more fluid to help pass the stone. In some cases, you may be given fluids through an IV tube put into one of your veins at the hospital. Take medicine for pain. Make changes in your diet to help keep kidney stones from coming back. Sometimes, medical procedures are needed to remove a kidney stone. This may involve: A procedure to break up kidney stones using a beam of light (laser) or shock waves. Surgery to remove the kidney stones. Follow these instructions at home: Medicines Take over-the-counter and prescription medicines only as told by your doctor. Ask your doctor if the medicine prescribed to you requires you to avoid driving or using heavy machinery. Eating and drinking Drink enough fluid to keep your pee pale yellow. You may be told to drink at least 8-10 glasses of water each day. This will help you pass the stone. If told by your doctor, change your diet. This may include: Limiting how much salt you eat. Eating more fruits and vegetables. Limiting how much meat, poultry, fish, and eggs you eat. Follow instructions from your doctor about eating or drinking restrictions. General instructions Collect pee samples as told by your doctor. You may need to collect a pee sample: 24 hours after a stone comes out. 8-12 weeks after a stone comes out, and every 6-12 months  after that. Strain your pee every time you pee (urinate), for as long as told. Use the strainer that your doctor recommends. Do not throw out the stone. Keep it so that it can be tested by your doctor. Keep all follow-up visits as told by your doctor. This is important. You may need follow-up tests. How is this prevented? To prevent another kidney stone: Drink enough fluid to keep your pee pale yellow. This is the best way to  prevent kidney stones. Eat healthy foods. Avoid certain foods as told by your doctor. You may be told to eat less protein. Stay at a healthy weight. Where to find more information National Kidney Foundation (NKF): www.kidney.org [facility]): www.urologyhealth.org Contact a doctor if: You have pain that gets worse or does not get better with medicine. Get help right away if: You have a fever or chills. You get very bad pain. You get new pain in your belly (abdomen). You pass out (faint). You cannot pee. Summary Kidney stones are rock-like masses that form inside of the kidneys. Kidney stones can cause very bad pain and can block the flow of pee. The stones will often pass out of the body through peeing. Drink enough fluid to keep your pee pale yellow. This information is not intended to replace advice given to you by your health care provider. Make sure you discuss any questions you have with your healthcare provider. Document Revised: 11/17/2018 Document Reviewed: 11/21/2018 Elsevier Patient Education  2022 [employer].

## 2020-12-25 NOTE — Progress Notes (Signed)
Subjective:  Patient ID: Martha Burgess, female    DOB: 1971-03-12  Age: 50 y.o. MRN: [id number]  Chief Complaint  Patient presents with  . Allergies    4 week Follow up    HPI  [name] is a 50 year old female that presents for follow-up of allergic rhinitis, right hip pain, and vitamin D deficiency. This is her second visit to the clinic.  Seasonal Allergies [name] has a past history of chronic allergic rhinitis. She was prescribed Singulair 10 mg daily 4-weeks ago. She states allergic rhinitis symptoms have improved. She says she is experiencing leg cramps at night. [name] has continued to take Zyrtec 10 mg daily. She has been referred to allergist, upcoming appt 01/13/21.   Kidney Stone left side She was found to have hematuria on initial visit on 11/27/20. X-ray revealed left nephrolithiasis. She is scheduled to see urology on 01/12/21. She states she experienced severe flank pain and gross hematuria approximately 2-weeks ago. She tells that she may have passed the kidney stone. States left flank pain and gross hematuria have subsided.  Vitamin D deficiency, follow-up [name] was found to have vitamin D deficiency of 21.5.She is currently treated with Vit D 50,000 U weekly, Vit D rich diet, and sun exposure.  Lab Results  Component Value Date   VD25OH 21.5 (L) 11/27/2020   CALCIUM 10.2 11/27/2020   Wt Readings from Last 3 Encounters:  12/25/20 174 lb (78.9 kg)  11/27/20 171 lb (77.6 kg)   ---------------------------------------------------------------------------------------------------   Current Outpatient Medications on File Prior to Visit  Medication Sig Dispense Refill  . cetirizine (ZYRTEC) 10 MG tablet Take 10 mg by mouth daily.    . montelukast (SINGULAIR) 10 MG tablet Take 1 tablet (10 mg total) by mouth at bedtime. 30 tablet 3  . Vitamin D, Ergocalciferol, (DRISDOL) 1.25 MG (50000 UNIT) CAPS capsule Take 1 capsule (50,000 Units total) by mouth every 7 (seven) days. 12  capsule 0   No current facility-administered medications on file prior to visit.   Past Medical History:  Diagnosis Date  . Anemia   . History of kidney stones   . Hx of migraines   . Seasonal allergies    Past Surgical History:  Procedure Laterality Date  . Left Foot Surgery- 2020     4 screws and 1 staple placed in foot.  . Right Shoulder Surgery- Torn Rotator Cuff/Tendon stretched- 2016      Family History  Problem Relation Age of Onset  . Heart attack Mother   . Diabetes Mother   . Hypertension Mother   . Hypotension Father   . Kidney failure Sister    Social History   Socioeconomic History  . Marital status: Married    Spouse name: Not on file  . Number of children: 3  . Years of education: Not on file  . Highest education level: Not on file  Occupational History  . Not on file  Tobacco Use  . Smoking status: Every Day    Packs/day: 1.00    Pack years: 0.00    Types: Cigarettes  . Smokeless tobacco: Never  Vaping Use  . Vaping Use: Never used  Substance and Sexual Activity  . Alcohol use: Never  . Drug use: Never  . Sexual activity: Not on file  Other Topics Concern  . Not on file  Social History Narrative  . Not on file   Social Determinants of Health   Financial Resource Strain: Not on file  Food Insecurity: Not  on file  Transportation Needs: Not on file  Physical Activity: Not on file  Stress: Not on file  Social Connections: Not on file    Review of Systems  Constitutional:  Positive for fatigue. Negative for fever.  HENT:  Positive for congestion, postnasal drip and rhinorrhea. Negative for ear pain, sinus pressure and sore throat.   Eyes:  Negative for pain.  Respiratory:  Negative for cough, chest tightness, shortness of breath and wheezing.   Cardiovascular:  Negative for chest pain and palpitations.  Gastrointestinal:  Negative for abdominal pain, constipation, diarrhea, nausea and vomiting.  Endocrine: Negative.   Genitourinary:   Positive for flank pain, hematuria and menstrual problem. Negative for dysuria.  Musculoskeletal:  Positive for back pain (left lower back pain) and myalgias (nocturnal leg cramps). Negative for arthralgias and joint swelling.  Skin:  Negative for rash.  Allergic/Immunologic: Positive for environmental allergies.  Neurological:  Negative for dizziness, weakness and headaches.  Hematological: Negative.   Psychiatric/Behavioral:  Negative for dysphoric mood. The patient is not nervous/anxious.     Objective:  BP 122/68 (BP Location: Left Arm, Patient Position: Sitting)   Pulse 74   Temp (!) 97.4 F (36.3 C) (Temporal)   Ht 5\' 7"  (1.702 m)   Wt 174 lb (78.9 kg)   SpO2 96%   BMI 27.25 kg/m   BP/Weight 12/25/2020 11/27/2020 01/08/2019  Systolic BP 122 128 98  Diastolic BP 68 82 66  Wt. (Lbs) 174 171 -  BMI 27.25 26.78 -    Physical Exam Vitals reviewed.  Constitutional:      Appearance: Normal appearance.  HENT:     Head: Normocephalic.     Right Ear: Tympanic membrane normal.     Left Ear: Tympanic membrane normal.     Nose: Congestion present.     Mouth/Throat:     Mouth: Mucous membranes are moist.     Pharynx: Posterior oropharyngeal erythema present.  Cardiovascular:     Rate and Rhythm: Normal rate and regular rhythm.     Pulses: Normal pulses.     Heart sounds: Normal heart sounds.  Pulmonary:     Effort: Pulmonary effort is normal.     Breath sounds: Normal breath sounds.  Abdominal:     General: Bowel sounds are normal.     Palpations: Abdomen is soft.  Musculoskeletal:        General: Tenderness (left lower back) present.     Cervical back: No tenderness.  Skin:    General: Skin is warm and dry.     Capillary Refill: Capillary refill takes less than 2 seconds.  Neurological:     General: No focal deficit present.     Mental Status: She is alert and oriented to person, place, and time.  Psychiatric:        Mood and Affect: Mood normal.        Behavior:  Behavior normal.    Lab Results  Component Value Date   WBC 8.4 11/27/2020   HGB 14.9 11/27/2020   HCT 44.7 11/27/2020   PLT 293 11/27/2020   GLUCOSE 86 11/27/2020   ALT 16 11/27/2020   AST 14 11/27/2020   NA 140 11/27/2020   K 4.1 11/27/2020   CL 101 11/27/2020   CREATININE 0.98 11/27/2020   BUN 10 11/27/2020   CO2 23 11/27/2020   TSH 2.160 11/27/2020      Assessment & Plan:   1. Seasonal allergies-improved -Continue Singulair 10 mg daily -Continue Zyrtec 10  mg daily -Consider smoking cessation  2. Kidney stone on left side - Comprehensive metabolic panel -Push fluids, especially water -Keep scheduled appt with urology  3. Vitamin D deficiency -vit D rich diet -Continue Vit D 50, 000 U -exposure to sunshine  4. Leg cramps - Folic Acid-Vit B6-Vit B12 0.5-5-0.2 MG TABS; Take 1 each by mouth daily.  Dispense: 30 tablet; Refill: 2  5. Abnormal uterine bleeding - norethindrone (AYGESTIN) 5 MG tablet; Take 2 tablets (10 mg total) by mouth daily.  Dispense: 10 tablet; Refill: 0  6. Weight gain - Lipid panel; Future    We will call you with lab results Keep appt with urologist and allergist as scheduled Perform leg stretches before going to bed Take B12 complex to help prevent leg cramps Fasting lipid panel and mammogram on August 29th Take Aygestin 10 mg once daily for uterine bleeding Follow-up in 84-months   Follow-up:35-months  An After Visit Summary was printed and given to the patient.   I , Elmer Sow as a scribe for Janie Morning, NP.,have documented all relevant documentation on the behalf of Janie Morning, NP,as directed by  Janie Morning, NP while in the presence of Janie Morning, NP.   I, Janie Morning, NP, have reviewed all documentation for this visit. The documentation on 12/26/20 for the exam, diagnosis, procedures, and orders are all accurate and complete.   Janie Morning, NP [facility] [phone]

## 2020-12-26 LAB — COMPREHENSIVE METABOLIC PANEL
ALT: 12 IU/L (ref 0–32)
AST: 17 IU/L (ref 0–40)
Albumin/Globulin Ratio: 1.8 (ref 1.2–2.2)
Albumin: 4.6 g/dL (ref 3.8–4.8)
Alkaline Phosphatase: 95 IU/L (ref 44–121)
BUN/Creatinine Ratio: 14 (ref 9–23)
BUN: 14 mg/dL (ref 6–24)
Bilirubin Total: 0.2 mg/dL (ref 0.0–1.2)
CO2: 23 mmol/L (ref 20–29)
Calcium: 9.7 mg/dL (ref 8.7–10.2)
Chloride: 103 mmol/L (ref 96–106)
Creatinine, Ser: 1.02 mg/dL — ABNORMAL HIGH (ref 0.57–1.00)
Globulin, Total: 2.5 g/dL (ref 1.5–4.5)
Glucose: 93 mg/dL (ref 65–99)
Potassium: 4.5 mmol/L (ref 3.5–5.2)
Sodium: 139 mmol/L (ref 134–144)
Total Protein: 7.1 g/dL (ref 6.0–8.5)
eGFR: 68 mL/min/{1.73_m2} (ref >59)

## 2021-01-13 ENCOUNTER — Encounter: Payer: Self-pay | Admitting: Allergy

## 2021-01-13 ENCOUNTER — Other Ambulatory Visit: Payer: Self-pay

## 2021-01-13 ENCOUNTER — Ambulatory Visit: Payer: Managed Care, Other (non HMO) | Admitting: Allergy

## 2021-01-13 VITALS — BP 108/70 | HR 70 | Resp 16 | Ht 68.0 in | Wt 173.2 lb

## 2021-01-13 DIAGNOSIS — H1013 Acute atopic conjunctivitis, bilateral: Secondary | ICD-10-CM

## 2021-01-13 DIAGNOSIS — J3089 Other allergic rhinitis: Secondary | ICD-10-CM

## 2021-01-13 MED ORDER — ALBUTEROL SULFATE HFA 108 (90 BASE) MCG/ACT IN AERS: INHALATION_SPRAY | RESPIRATORY_TRACT | 1 refills | Status: AC

## 2021-01-13 NOTE — Progress Notes (Signed)
New Patient Note  RE: Martha Burgess MRN: [id number] DOB: 02-Dec-1970 Date of Office Visit: 01/13/2021  Referring provider: Janie Morning, NP Primary care provider: Janie Morning, NP  Chief Complaint: allergies  History of present illness: Martha Burgess is a 50 y.o. female presenting today for consultation for allergic rhinitis.   She reports allergy symptoms of itchy/watery/burning eyes, headaches (behind the eyes), sneezing, on/off nasal drainage with cough.  She also has noted some shortness of breath and wheeze when her allergy symptoms are worse.  She denies history of asthma and has not had any inhalers.  She was started on singulair about a month ago and she does feel like it helps. She also takes zyrtec daily for years but does state she rotates through claritin and Careers adviser.  She has not used any eye drops.  She states she can't put anything up her nose.  She states in 2019 she had allergy skin testing in [place], [place] and states she was positive to grass pollen, tree pollen, dog.  She states she was recommended to do allergen immunotherapy and states she started but shortly after starting Covid became prevalent and thus she discontinued.  She states she was told not to eat peanut, milk or wheat products as well based on testing.   No history of eczema or food allergy.  She states she did cut down on dairy and wheat after being told she needed to avoid.  She states she eats dairy, wheat and peanut without issue.  She loves peanut butter and still eats it quite regularly.    She states she has been stung by bees and other stinging insects many times and states the bite site gets red and swollen only. Denies respiratory, CV or GI symptoms with stings.     Review of systems: Review of Systems  Constitutional: Negative.   HENT:         See HPI  Eyes:        See HPI  Respiratory:         See HPI  Cardiovascular: Negative.   Gastrointestinal: Negative.   Musculoskeletal:  Negative.   Skin: Negative.   Neurological:        See HPI   All other systems negative unless noted above in HPI  Past medical history: Past Medical History:  Diagnosis Date   Anemia    History of kidney stones    Hx of migraines    Seasonal allergies     Past surgical history: Past Surgical History:  Procedure Laterality Date   Left Foot Surgery- 2020     4 screws and 1 staple placed in foot.   Right Shoulder Surgery- Torn Rotator Cuff/Tendon stretched- 2016     TUBAL LIGATION      Family history:  Family History  Problem Relation Age of Onset   Heart attack Mother    Diabetes Mother    Hypertension Mother    Hypotension Father    Kidney failure Sister     Social history: She lives in a mobile home with carpeting in the bedroom with electric heating and central cooling.  Dog in the home.  There is no concern for water damage, mildew or roaches in the home.  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    Tobacco Use   Smoking status: Every Day    Packs/day: 1.00  Pack years: 0.00    Types: Cigarettes   Smokeless tobacco: Never  Vaping Use   Vaping Use: Never used     Medication List: Current Outpatient Medications  Medication Sig Dispense Refill   cetirizine (ZYRTEC) 10 MG tablet Take 10 mg by mouth daily.     Folic Acid-Vit B6-Vit B12 0.5-5-0.2 MG TABS Take 1 each by mouth daily. 30 tablet 2   montelukast (SINGULAIR) 10 MG tablet Take 1 tablet (10 mg total) by mouth at bedtime. 30 tablet 3   Vitamin D, Ergocalciferol, (DRISDOL) 1.25 MG (50000 UNIT) CAPS capsule Take 1 capsule (50,000 Units total) by mouth every 7 (seven) days. 12 capsule 0   No current facility-administered medications for this visit.    Known medication allergies: No Known Allergies    Physical examination: Blood pressure 108/70, pulse 70, resp. rate 16, height 5\' 8"  (1.727 m), weight 173 lb 3.2 oz (78.6  kg), SpO2 97 %.  General: Alert, interactive, in no acute distress. HEENT: PERRLA, TMs pearly gray, turbinates moderately edematous with clear discharge, post-pharynx non erythematous. Neck: Supple without lymphadenopathy. Lungs: Clear to auscultation without wheezing, rhonchi or rales. {no increased work of breathing. CV: Normal S1, S2 without murmurs. Abdomen: Nondistended, nontender. Skin: Warm and dry, without lesions or rashes. Extremities:  No clubbing, cyanosis or edema. Neuro:   Grossly intact.  Diagnositics/Labs:  Allergy testing: Environmental allergy skin prick testing is positive to Timothy grass, weed pollens, tree pollens, Cladosporium, penicillium, Fusarium, epicoccum, candida albicans.  Intradermal testing is negative.  Allergy testing results were read and interpreted by provider, documented by clinical staff.   Assessment and plan: Allergic rhinitis with conjunctivitis  -Environmental allergy testing is positive to grass pollen, weed, pollen, tree pollen, mold, dust mite, mixed feathers -Allergen avoidance measures discussed/handouts provided -Recommend you try Xyzal 5 mg daily.  This is a long-acting antihistamine similar to Zyrtec, Claritin and Allegra that may be more effective -Continue Singulair 10 mg daily at bedtime -For itchy, watery eyes to use over-the-counter Pataday 1 drop each eye daily as needed -Have access to albuterol inhaler 2 puffs every 4-6 hours as needed for cough/wheeze/shortness of breath/chest tightness.  May use 15-20 minutes prior to activity.   Monitor frequency of use.   -Allergen immunotherapy discussed today including protocol, benefits and risk.  Informational handout provided.  If interested in this therapuetic option you can check with your insurance carrier for coverage.  Let know if you would like to proceed with this option.     -You do not have food allergy.  Recommend you keep peanut, milk/dairy and wheat products in your diet.   You are sensitized only if they were positive on your previous testing (see below explanation on sensitivity) -we have discussed the following in regards to foods:   Allergy: food allergy is when you have eaten a food, developed an allergic reaction after eating the food and have IgE to the food (positive food testing either by skin testing or blood testing).  Food allergy could lead to life threatening symptoms  Sensitivity: occurs when you have IgE to a food (positive food testing either by skin testing or blood testing) but is a food you eat without any issues.  This is not an allergy and we recommend keeping the food in the diet  Intolerance: this is when you have negative testing by either skin testing or blood testing thus not allergic but the food causes symptoms (like belly pain, bloating, diarrhea etc) with ingestion.  These foods should be avoided to prevent symptoms.     -You also do not have a bee pollen allergy or a stinging insect allergy   Follow-up in 3 to 4 months or sooner if needed  I appreciate the opportunity to take part in Kaziah's care. Please do not hesitate to contact me with questions.  Sincerely,   Margo Aye, MD Allergy/Immunology [facility]

## 2021-01-13 NOTE — Patient Instructions (Addendum)
-  Environmental allergy testing is positive to grass pollen, weed, pollen, tree pollen, mold, dust mite, mixed feathers -Allergen avoidance measures discussed/handouts provided -Recommend you try Xyzal 5 mg daily.  This is a long-acting antihistamine similar to Zyrtec, Claritin and Allegra that may be more effective -Continue Singulair 10 mg daily at bedtime -For itchy, watery eyes to use over-the-counter Pataday 1 drop each eye daily as needed -Have access to albuterol inhaler 2 puffs every 4-6 hours as needed for cough/wheeze/shortness of breath/chest tightness.  May use 15-20 minutes prior to activity.   Monitor frequency of use.   -Allergen immunotherapy discussed today including protocol, benefits and risk.  Informational handout provided.  If interested in this therapuetic option you can check with your insurance carrier for coverage.  Let us know if you would like to proceed with this option.     -You do not have food allergy.  Recommend you keep peanut, milk/dairy and wheat products in your diet.  You are sensitized only if they were positive on your previous testing (see below explanation on sensitivity) -we have discussed the following in regards to foods:   Allergy: food allergy is when you have eaten a food, developed an allergic reaction after eating the food and have IgE to the food (positive food testing either by skin testing or blood testing).  Food allergy could lead to life threatening symptoms  Sensitivity: occurs when you have IgE to a food (positive food testing either by skin testing or blood testing) but is a food you eat without any issues.  This is not an allergy and we recommend keeping the food in the diet  Intolerance: this is when you have negative testing by either skin testing or blood testing thus not allergic but the food causes symptoms (like belly pain, bloating, diarrhea etc) with ingestion.  These foods should be avoided to prevent symptoms.     -You also do not  have a bee pollen allergy or a stinging insect allergy   Follow-up in 3 to 4 months or sooner if needed

## 2021-03-16 ENCOUNTER — Other Ambulatory Visit: Payer: Self-pay

## 2021-03-16 ENCOUNTER — Ambulatory Visit
Admission: RE | Admit: 2021-03-16 | Discharge: 2021-03-16 | Disposition: A | Discharge status: Home / Self Care | Payer: Managed Care, Other (non HMO) | Source: Ambulatory Visit | Attending: Nurse Practitioner | Admitting: Nurse Practitioner

## 2021-03-16 ENCOUNTER — Other Ambulatory Visit: Payer: Self-pay | Admitting: Nurse Practitioner

## 2021-03-16 ENCOUNTER — Other Ambulatory Visit: Payer: Managed Care, Other (non HMO)

## 2021-03-16 DIAGNOSIS — R635 Abnormal weight gain: Secondary | ICD-10-CM

## 2021-03-16 DIAGNOSIS — Z1231 Encounter for screening mammogram for malignant neoplasm of breast: Secondary | ICD-10-CM

## 2021-03-17 LAB — LIPID PANEL
Chol/HDL Ratio: 3.4 ratio (ref 0.0–4.4)
Cholesterol, Total: 158 mg/dL (ref 100–199)
HDL: 47 mg/dL (ref >39)
LDL Chol Calc (NIH): 96 mg/dL (ref 0–99)
Triglycerides: 78 mg/dL (ref 0–149)
VLDL Cholesterol Cal: 15 mg/dL (ref 5–40)

## 2021-03-17 LAB — CARDIOVASCULAR RISK ASSESSMENT

## 2021-06-25 ENCOUNTER — Encounter: Payer: Self-pay | Admitting: Nurse Practitioner

## 2021-06-25 ENCOUNTER — Other Ambulatory Visit: Payer: Self-pay

## 2021-06-25 ENCOUNTER — Ambulatory Visit: Payer: Managed Care, Other (non HMO) | Admitting: Nurse Practitioner

## 2021-06-25 VITALS — BP 116/78 | HR 85 | Temp 97.2°F | Ht 68.0 in | Wt 166.0 lb

## 2021-06-25 DIAGNOSIS — M545 Low back pain, unspecified: Secondary | ICD-10-CM | POA: Diagnosis not present

## 2021-06-25 DIAGNOSIS — F17211 Nicotine dependence, cigarettes, in remission: Secondary | ICD-10-CM | POA: Diagnosis not present

## 2021-06-25 DIAGNOSIS — M25551 Pain in right hip: Secondary | ICD-10-CM

## 2021-06-25 DIAGNOSIS — M159 Polyosteoarthritis, unspecified: Secondary | ICD-10-CM | POA: Diagnosis not present

## 2021-06-25 DIAGNOSIS — E559 Vitamin D deficiency, unspecified: Secondary | ICD-10-CM

## 2021-06-25 DIAGNOSIS — K219 Gastro-esophageal reflux disease without esophagitis: Secondary | ICD-10-CM

## 2021-06-25 MED ORDER — PANTOPRAZOLE SODIUM 40 MG PO TBEC: 40.0000 mg | DELAYED_RELEASE_TABLET | Freq: Every day | ORAL | 3 refills | Status: DC

## 2021-06-25 MED ORDER — KETOROLAC TROMETHAMINE 60 MG/2ML IM SOLN
60.0000 mg | Freq: Once | INTRAMUSCULAR | Status: AC
  Administered 2021-06-25: 60 mg via INTRAMUSCULAR

## 2021-06-25 MED ORDER — TRIAMCINOLONE ACETONIDE 40 MG/ML IJ SUSP
60.0000 mg | Freq: Once | INTRAMUSCULAR | Status: AC
  Administered 2021-06-25: 60 mg via INTRAMUSCULAR

## 2021-06-25 MED ORDER — MELOXICAM 7.5 MG PO TABS
7.5000 mg | ORAL_TABLET | Freq: Two times a day (BID) | ORAL | 1 refills | Status: DC
Start: 2021-06-25 — End: 2022-06-28

## 2021-06-25 NOTE — Progress Notes (Signed)
Subjective:  Patient ID: Martha Burgess, female    DOB: 1970-12-18  Age: 50 y.o. MRN: [id number]  Chief Complaint  Patient presents with   6 month follow up    HPI   Patient presents today for 6 month follow up GERD, right hip pain, and Vit D deficiency.She tells me that she had an on-the-job  crushing injury to right great toe. She did not require surgical intervention. Martha Burgess tells me that she has experienced increased stress at work after receiving a promotion. She has quit smoking cigarettes after since last visit.   GERD, Follow up:  The patient was last seen for GERD 6 months ago. Current treatment includes avoiding foods that trigger GERD  She reports good compliance with treatment. She is not having side effects.  She IS experiencing belching and eructation. She is NOT experiencing difficulty swallowing    Right Hip Pain  She reports recurrent right hip pain. was not an injury that may have caused the pain. The pain started a few years ago and is staying constant. The pain does radiate down right leg. The pain is described as aching, sharp, soreness, stiffness, and throbbing, is 8/10 in intensity, occurring intermittently. Symptoms are worse in the: evening  Aggravating factors: sitting Relieving factors: walking.  She has tried NSAIDs with little relief.  Pt states she is taking "lots" of Ibuprofen for hip pain.   Vitamin D deficiency, follow-up  Lab Results  Component Value Date   VD25OH 21.5 (L) 11/27/2020   CALCIUM 9.7 12/25/2020   CALCIUM 10.2 11/27/2020   Wt Readings from Last 3 Encounters:  06/25/21 166 lb (75.3 kg)  01/13/21 173 lb 3.2 oz (78.6 kg)  12/25/20 174 lb (78.9 kg)    She was last seen for vitamin D deficiency 6 months ago.  Management since that visit includes vitamin D rich diet. She reports excellent compliance with treatment. She is not having side effects.   Symptoms: No change in energy level No numbness or tingling  Yes bone pain No  unexplained fracture   ---------------------------------------------------------------------------------------------------     Current Outpatient Medications on File Prior to Visit  Medication Sig Dispense Refill   albuterol (VENTOLIN HFA) 108 (90 Base) MCG/ACT inhaler Inhale two puffs every 4-6 hours if needed for coughing, wheezing, or shortness of breath. 1 each 1   cetirizine (ZYRTEC) 10 MG tablet Take 10 mg by mouth daily. (Patient not taking: Reported on 06/25/2021)     Folic Acid-Vit B6-Vit B12 0.5-5-0.2 MG TABS Take 1 each by mouth daily. 30 tablet 2   montelukast (SINGULAIR) 10 MG tablet Take 1 tablet (10 mg total) by mouth at bedtime. (Patient not taking: Reported on 06/25/2021) 30 tablet 3   Vitamin D, Ergocalciferol, (DRISDOL) 1.25 MG (50000 UNIT) CAPS capsule Take 1 capsule (50,000 Units total) by mouth every 7 (seven) days. (Patient not taking: Reported on 06/25/2021) 12 capsule 0   No current facility-administered medications on file prior to visit.   Past Medical History:  Diagnosis Date   Anemia    History of kidney stones    Hx of migraines    Seasonal allergies    Past Surgical History:  Procedure Laterality Date   Left Foot Surgery- 2020     4 screws and 1 staple placed in foot.   Right Shoulder Surgery- Torn Rotator Cuff/Tendon stretched- 2016     TUBAL LIGATION      Family History  Problem Relation Age of Onset   Heart attack Mother  Diabetes Mother    Hypertension Mother    Hypotension Father    Kidney failure Sister    Social History   Socioeconomic History   Marital status: Married    Spouse name: Not on file   Number of children: 3   Years of education: Not on file   Highest education level: Not on file  Occupational History   Not on file  Tobacco Use   Smoking status: Every Day    Packs/day: 1.00    Types: Cigarettes   Smokeless tobacco: Never  Vaping Use   Vaping Use: Never used  Substance and Sexual Activity   Alcohol use: Never    Drug use: Never   Sexual activity: Not on file  Other Topics Concern   Not on file  Social History Narrative   Not on file   Social Determinants of Health   Financial Resource Strain: Not on file  Food Insecurity: Not on file  Transportation Needs: Not on file  Physical Activity: Not on file  Stress: Not on file  Social Connections: Not on file    Review of Systems  Constitutional:  Negative for chills, fatigue and fever.  HENT:  Negative for congestion, ear pain, rhinorrhea and sore throat.   Respiratory:  Negative for cough and shortness of breath.   Cardiovascular:  Negative for chest pain.  Gastrointestinal:  Negative for abdominal pain, constipation, diarrhea, nausea and vomiting.  Genitourinary:  Negative for dysuria and urgency.  Musculoskeletal:  Positive for arthralgias (right hip) and back pain (lumbar). Negative for myalgias.  Neurological:  Negative for dizziness, weakness, light-headedness and headaches.  Psychiatric/Behavioral:  Negative for dysphoric mood. The patient is not nervous/anxious.     Objective:  BP 116/78   Pulse 85   Temp (!) 97.2 F (36.2 C)   Ht 5\' 8"  (1.727 m)   Wt 166 lb (75.3 kg)   SpO2 99%   BMI 25.24 kg/m   BP/Weight 06/25/2021 01/13/2021 12/25/2020  Systolic BP 116 108 122  Diastolic BP 78 70 68  Wt. (Lbs) 166 173.2 174  BMI 25.24 26.33 27.25    Physical Exam Vitals reviewed.  Constitutional:      Appearance: Normal appearance.  HENT:     Head: Normocephalic.     Right Ear: Tympanic membrane normal.     Left Ear: Tympanic membrane normal.     Nose: Nose normal.     Mouth/Throat:     Mouth: Mucous membranes are moist.  Eyes:     Pupils: Pupils are equal, round, and reactive to light.  Cardiovascular:     Rate and Rhythm: Normal rate and regular rhythm.     Pulses: Normal pulses.     Heart sounds: Normal heart sounds.  Pulmonary:     Effort: Pulmonary effort is normal.     Breath sounds: Normal breath sounds.  Abdominal:      General: Bowel sounds are normal.     Palpations: Abdomen is soft.  Musculoskeletal:        General: Tenderness (right hip) present.  Skin:    General: Skin is warm and dry.     Capillary Refill: Capillary refill takes less than 2 seconds.  Neurological:     General: No focal deficit present.     Mental Status: She is alert and oriented to person, place, and time.  Psychiatric:        Mood and Affect: Mood normal.        Behavior: Behavior normal.  Lab Results  Component Value Date   WBC 8.4 11/27/2020   HGB 14.9 11/27/2020   HCT 44.7 11/27/2020   PLT 293 11/27/2020   GLUCOSE 93 12/25/2020   CHOL 158 03/16/2021   TRIG 78 03/16/2021   HDL 47 03/16/2021   LDLCALC 96 03/16/2021   ALT 12 12/25/2020   AST 17 12/25/2020   NA 139 12/25/2020   K 4.5 12/25/2020   CL 103 12/25/2020   CREATININE 1.02 (H) 12/25/2020   BUN 14 12/25/2020   CO2 23 12/25/2020   TSH 2.160 11/27/2020      Assessment & Plan:   1. Gastroesophageal reflux disease, unspecified whether esophagitis present - Comprehensive metabolic panel - CBC with Differential/Platelet - pantoprazole (PROTONIX) 40 MG tablet; Take 1 tablet (40 mg total) by mouth daily.  Dispense: 90 tablet; Refill: 3 -avoid foods that trigger GERD  2. Primary osteoarthritis involving multiple joints - triamcinolone acetonide (KENALOG-40) injection 60 mg - ketorolac (TORADOL) injection 60 mg - meloxicam (MOBIC) 7.5 MG tablet; Take 1 tablet (7.5 mg total) by mouth 2 (two) times daily.  Dispense: 180 tablet; Refill: 1  3. Vitamin D deficiency - VITAMIN D 25 Hydroxy (Vit-D Deficiency, Fractures)  4. Lumbar pain - triamcinolone acetonide (KENALOG-40) injection 60 mg - ketorolac (TORADOL) injection 60 mg  5. Right hip pain - triamcinolone acetonide (KENALOG-40) injection 60 mg - ketorolac (TORADOL) injection 60 mg - meloxicam (MOBIC) 7.5 MG tablet; Take 1 tablet (7.5 mg total) by mouth 2 (two) times daily.  Dispense:  180 tablet; Refill: 1     Begin Protonix 40 mg daily Avoid foods that trigger GERD Begin Mobic 7.5 mg twice daily for osteoarthritis We will call you with lab results Recommend eye exam and colonoscopy Continue medications Follow-up in 49-months, fasting     Follow-up: 44-months, fasting  An After Visit Summary was printed and given to the patient.  Janie Morning, NP [facility] [phone]

## 2021-06-25 NOTE — Patient Instructions (Addendum)
Begin Protonix 40 mg daily Avoid foods that trigger GERD Begin Mobic 7.5 mg twice daily for osteoarthritis We will call you with lab results Recommend eye exam and colonoscopy Continue medications Follow-up in 34-month, fasting    Preventive Care [id number] Old, Female Preventive care refers to lifestyle choices and visits with your health care provider that can promote health and wellness. Preventive care visits are also called wellness exams. What can I expect for my preventive care visit? Counseling Your health care provider may ask you questions about your: Medical history, including: Past medical problems. Family medical history. Pregnancy history. Current health, including: Menstrual cycle. Method of birth control. Emotional well-being. Home life and relationship well-being. Sexual activity and sexual health. Lifestyle, including: Alcohol, nicotine or tobacco, and drug use. Access to firearms. Diet, exercise, and sleep habits. Work and work [employer] use. Safety issues such as seatbelt and bike helmet use. Physical exam Your health care provider will check your: Height and weight. These may be used to calculate your BMI (body mass index). BMI is a measurement that tells if you are at a healthy weight. Waist circumference. This measures the distance around your waistline. This measurement also tells if you are at a healthy weight and may help predict your risk of certain diseases, such as type 2 diabetes and high blood pressure. Heart rate and blood pressure. Body temperature. Skin for abnormal spots. What immunizations do I need? Vaccines are usually given at various ages, according to a schedule. Your health care provider will recommend vaccines for you based on your age, medical history, and lifestyle or other factors, such as travel or where you work. What tests do I need? Screening Your health care provider may recommend screening tests for certain  conditions. This may include: Lipid and cholesterol levels. Diabetes screening. This is done by checking your blood sugar (glucose) after you have not eaten for a while (fasting). Pelvic exam and Pap test. Hepatitis B test. Hepatitis C test. HIV (human immunodeficiency virus) test. STI (sexually transmitted infection) testing, if you are at risk. Lung cancer screening. Colorectal cancer screening. Mammogram. Talk with your health care provider about when you should start having regular mammograms. This may depend on whether you have a family history of breast cancer. BRCA-related cancer screening. This may be done if you have a family history of breast, ovarian, tubal, or peritoneal cancers. Bone density scan. This is done to screen for osteoporosis. Talk with your health care provider about your test results, treatment options, and if necessary, the need for more tests. Follow these instructions at home: Eating and drinking  Eat a diet that includes fresh fruits and vegetables, whole grains, lean protein, and low-fat dairy products. Take vitamin and mineral supplements as recommended by your health care provider. Do not drink alcohol if: Your health care provider tells you not to drink. You are pregnant, may be pregnant, or are planning to become pregnant. If you drink alcohol: Limit how much you have to 0-1 drink a day. Know how much alcohol is in your drink. In the [place] one drink equals one 12 oz bottle of beer (355 mL), one 5 oz glass of wine (148 mL), or one 1 oz glass of hard liquor (44 mL). Lifestyle Brush your teeth every morning and night with fluoride toothpaste. Floss one time each day. Exercise for at least 30 minutes 5 or more days each week. Do not use any products that contain nicotine or tobacco. These products include cigarettes, chewing  tobacco, and vaping devices, such as e-cigarettes. If you need help quitting, ask your health care provider. Do not use drugs. If  you are sexually active, practice safe sex. Use a condom or other form of protection to prevent STIs. If you do not wish to become pregnant, use a form of birth control. If you plan to become pregnant, see your health care provider for a prepregnancy visit. Take aspirin only as told by your health care provider. Make sure that you understand how much to take and what form to take. Work with your health care provider to find out whether it is safe and beneficial for you to take aspirin daily. Find healthy ways to manage stress, such as: Meditation, yoga, or listening to music. Journaling. Talking to a trusted person. Spending time with friends and family. Minimize exposure to UV radiation to reduce your risk of skin cancer. Safety Always wear your seat belt while driving or riding in a vehicle. Do not drive: If you have been drinking alcohol. Do not ride with someone who has been drinking. When you are tired or distracted. While texting. If you have been using any mind-altering substances or drugs. Wear a helmet and other protective equipment during sports activities. If you have firearms in your house, make sure you follow all gun safety procedures. Seek help if you have been physically or sexually abused. What's next? Visit your health care provider once a year for an annual wellness visit. Ask your health care provider how often you should have your eyes and teeth checked. Stay up to date on all vaccines. This information is not intended to replace advice given to you by your health care provider. Make sure you discuss any questions you have with your health care provider. Document Revised: 12/31/2020 Document Reviewed: 12/31/2020 Elsevier Patient Education  [place].      Gastroesophageal Reflux Disease, Adult Gastroesophageal reflux (GER) happens when acid from the stomach flows up into the tube that connects the mouth and the stomach (esophagus). Normally, food travels  down the esophagus and stays in the stomach to be digested. With GER, food and stomach acid sometimes move back up into the esophagus. You may have a disease called gastroesophageal reflux disease (GERD) if the reflux: Happens often. Causes frequent or very bad symptoms. Causes problems such as damage to the esophagus. When this happens, the esophagus becomes sore and swollen. Over time, GERD can make small holes (ulcers) in the lining of the esophagus. What are the causes? This condition is caused by a problem with the muscle between the esophagus and the stomach. When this muscle is weak or not normal, it does not close properly to keep food and acid from coming back up from the stomach. The muscle can be weak because of: Tobacco use. Pregnancy. Having a certain type of hernia (hiatal hernia). Alcohol use. Certain foods and drinks, such as coffee, chocolate, onions, and peppermint. What increases the risk? Being overweight. Having a disease that affects your connective tissue. Taking NSAIDs, such a ibuprofen. What are the signs or symptoms? Heartburn. Difficult or painful swallowing. The feeling of having a lump in the throat. A bitter taste in the mouth. Bad breath. Having a lot of saliva. Having an upset or bloated stomach. Burping. Chest pain. Different conditions can cause chest pain. Make sure you see your doctor if you have chest pain. Shortness of breath or wheezing. A long-term cough or a cough at night. Wearing away of the surface of  teeth (tooth enamel). Weight loss. How is this treated? Making changes to your diet. Taking medicine. Having surgery. Treatment will depend on how bad your symptoms are. Follow these instructions at home: Eating and drinking  Follow a diet as told by your doctor. You may need to avoid foods and drinks such as: Coffee and tea, with or without caffeine. Drinks that contain alcohol. Energy drinks and sports drinks. Bubbly (carbonated)  drinks or sodas. Chocolate and cocoa. Peppermint and mint flavorings. Garlic and onions. Horseradish. Spicy and acidic foods. These include peppers, chili powder, curry powder, vinegar, hot sauces, and BBQ sauce. Citrus fruit juices and citrus fruits, such as oranges, lemons, and limes. Tomato-based foods. These include red sauce, chili, salsa, and pizza with red sauce. Fried and fatty foods. These include donuts, french fries, potato chips, and high-fat dressings. High-fat meats. These include hot dogs, rib eye steak, sausage, ham, and bacon. High-fat dairy items, such as whole milk, butter, and cream cheese. Eat small meals often. Avoid eating large meals. Avoid drinking large amounts of liquid with your meals. Avoid eating meals during the 2-3 hours before bedtime. Avoid lying down right after you eat. Do not exercise right after you eat. Lifestyle  Do not smoke or use any products that contain nicotine or tobacco. If you need help quitting, ask your doctor. Try to lower your stress. If you need help doing this, ask your doctor. If you are overweight, lose an amount of weight that is healthy for you. Ask your doctor about a safe weight loss goal. General instructions Pay attention to any changes in your symptoms. Take over-the-counter and prescription medicines only as told by your doctor. Do not take aspirin, ibuprofen, or other NSAIDs unless your doctor says it is okay. Wear loose clothes. Do not wear anything tight around your waist. Raise (elevate) the head of your bed about 6 inches (15 cm). You may need to use a wedge to do this. Avoid bending over if this makes your symptoms worse. Keep all follow-up visits. Contact a doctor if: You have new symptoms. You lose weight and you do not know why. You have trouble swallowing or it hurts to swallow. You have wheezing or a cough that keeps happening. You have a hoarse voice. Your symptoms do not get better with treatment. Get  help right away if: You have sudden pain in your arms, neck, jaw, teeth, or back. You suddenly feel sweaty, dizzy, or light-headed. You have chest pain or shortness of breath. You vomit and the vomit is green, yellow, or black, or it looks like blood or coffee grounds. You faint. Your poop (stool) is red, bloody, or black. You cannot swallow, drink, or eat. These symptoms may represent a serious problem that is an emergency. Do not wait to see if the symptoms will go away. Get medical help right away. Call your local emergency services (911 in the [place] Do not drive yourself to the hospital. Summary If a person has gastroesophageal reflux disease (GERD), food and stomach acid move back up into the esophagus and cause symptoms or problems such as damage to the esophagus. Treatment will depend on how bad your symptoms are. Follow a diet as told by your doctor. Take all medicines only as told by your doctor. This information is not intended to replace advice given to you by your health care provider. Make sure you discuss any questions you have with your health care provider. Document Revised: 01/14/2020 Document Reviewed: 01/14/2020 Elsevier Patient Education  [place] for Gastroesophageal Reflux Disease, Adult When you have gastroesophageal reflux disease (GERD), the foods you eat and your eating habits are very important. Choosing the right foods can help ease your discomfort. Think about working with a food expert (dietitian) to help you make good choices. What are tips for following this plan? Reading food labels Look for foods that are low in saturated fat. Foods that may help with your symptoms include: Foods that have less than 5% of daily value (DV) of fat. Foods that have 0 grams of trans fat. Cooking Do not fry your food. Cook your food by baking, steaming, grilling, or broiling. These are all methods that do not need a lot of fat for cooking. To add  flavor, try to use herbs that are low in spice and acidity. Meal planning  Choose healthy foods that are low in fat, such as: Fruits and vegetables. Whole grains. Low-fat dairy products. Lean meats, fish, and poultry. Eat small meals often instead of eating 3 large meals each day. Eat your meals slowly in a place where you are relaxed. Avoid bending over or lying down until 2-3 hours after eating. Limit high-fat foods such as fatty meats or fried foods. Limit your intake of fatty foods, such as oils, butter, and shortening. Avoid the following as told by your doctor: Foods that cause symptoms. These may be different for different people. Keep a food diary to keep track of foods that cause symptoms. Alcohol. Drinking a lot of liquid with meals. Eating meals during the 2-3 hours before bed. Lifestyle Stay at a healthy weight. Ask your doctor what weight is healthy for you. If you need to lose weight, work with your doctor to do so safely. Exercise for at least 30 minutes on 5 or more days each week, or as told by your doctor. Wear loose-fitting clothes. Do not smoke or use any products that contain nicotine or tobacco. If you need help quitting, ask your doctor. Sleep with the head of your bed higher than your feet. Use a wedge under the mattress or blocks under the bed frame to raise the head of the bed. Chew sugar-free gum after meals. What foods should eat? Eat a healthy, well-balanced diet of fruits, vegetables, whole grains, low-fat dairy products, lean meats, fish, and poultry. Each person is different. Foods that may cause symptoms in one person may not cause any symptoms in another person. Work with your doctor to find foods that are safe for you. The items listed above may not be a complete list of what you can eat and drink. Contact a food expert for more options. What foods should I avoid? Limiting some of these foods may help in managing the symptoms of GERD. Everyone is  different. Talk with a food expert or your doctor to help you find the exact foods to avoid, if any. Fruits Any fruits prepared with added fat. Any fruits that cause symptoms. For some people, this may include citrus fruits, such as oranges, grapefruit, pineapple, and lemons. Vegetables Deep-fried vegetables. [place] fries. Any vegetables prepared with added fat. Any vegetables that cause symptoms. For some people, this may include tomatoes and tomato products, chili peppers, onions and garlic, and horseradish. Grains Pastries or quick breads with added fat. Meats and other proteins High-fat meats, such as fatty beef or pork, hot dogs, ribs, ham, sausage, salami, and bacon. Fried meat or protein, including fried fish and fried chicken. Nuts and nut butters, in large  amounts. Dairy Whole milk and chocolate milk. Sour cream. Cream. Ice cream. Cream cheese. Milkshakes. Fats and oils Butter. Margarine. Shortening. Ghee. Beverages Coffee and tea, with or without caffeine. Carbonated beverages. Sodas. Energy drinks. Fruit juice made with acidic fruits, such as orange or grapefruit. Tomato juice. Alcoholic drinks. Sweets and desserts Chocolate and cocoa. Donuts. Seasonings and condiments Pepper. Peppermint and spearmint. Added salt. Any condiments, herbs, or seasonings that cause symptoms. For some people, this may include curry, hot sauce, or vinegar-based salad dressings. The items listed above may not be a complete list of what you should not eat and drink. Contact a food expert for more options. Questions to ask your doctor Diet and lifestyle changes are often the first steps that are taken to manage symptoms of GERD. If diet and lifestyle changes do not help, talk with your doctor about taking medicines. Where to find more information [employer]intestinal [employer]: aboutgerd.org Summary When you have GERD, food and lifestyle choices are very important in easing your  symptoms. Eat small meals often instead of 3 large meals a day. Eat your meals slowly and in a place where you are relaxed. Avoid bending over or lying down until 2-3 hours after eating. Limit high-fat foods such as fatty meats or fried foods. This information is not intended to replace advice given to you by your health care provider. Make sure you discuss any questions you have with your health care provider. Document Revised: 01/14/2020 Document Reviewed: 01/14/2020 Elsevier Patient Education  [place].

## 2021-06-26 ENCOUNTER — Other Ambulatory Visit: Payer: Self-pay | Admitting: Nurse Practitioner

## 2021-06-26 DIAGNOSIS — E559 Vitamin D deficiency, unspecified: Secondary | ICD-10-CM

## 2021-06-26 LAB — CBC WITH DIFFERENTIAL/PLATELET
Basophils Absolute: 0.1 10*3/uL (ref 0.0–0.2)
Basos: 1 %
EOS (ABSOLUTE): 0.1 10*3/uL (ref 0.0–0.4)
Eos: 2 %
Hematocrit: 40.4 % (ref 34.0–46.6)
Hemoglobin: 13.7 g/dL (ref 11.1–15.9)
Immature Grans (Abs): 0 10*3/uL (ref 0.0–0.1)
Immature Granulocytes: 0 %
Lymphocytes Absolute: 2 10*3/uL (ref 0.7–3.1)
Lymphs: 28 %
MCH: 29.5 pg (ref 26.6–33.0)
MCHC: 33.9 g/dL (ref 31.5–35.7)
MCV: 87 fL (ref 79–97)
Monocytes Absolute: 0.5 10*3/uL (ref 0.1–0.9)
Monocytes: 7 %
Neutrophils Absolute: 4.4 10*3/uL (ref 1.4–7.0)
Neutrophils: 62 %
Platelets: 312 10*3/uL (ref 150–450)
RBC: 4.64 x10E6/uL (ref 3.77–5.28)
RDW: 13.1 % (ref 11.7–15.4)
WBC: 7.1 10*3/uL (ref 3.4–10.8)

## 2021-06-26 LAB — VITAMIN D 25 HYDROXY (VIT D DEFICIENCY, FRACTURES): Vit D, 25-Hydroxy: 23.6 ng/mL — ABNORMAL LOW (ref 30.0–100.0)

## 2021-06-26 LAB — COMPREHENSIVE METABOLIC PANEL
ALT: 19 IU/L (ref 0–32)
AST: 21 IU/L (ref 0–40)
Albumin/Globulin Ratio: 2 (ref 1.2–2.2)
Albumin: 4.8 g/dL (ref 3.8–4.8)
Alkaline Phosphatase: 97 IU/L (ref 44–121)
BUN/Creatinine Ratio: 9 (ref 9–23)
BUN: 8 mg/dL (ref 6–24)
Bilirubin Total: 0.5 mg/dL (ref 0.0–1.2)
CO2: 24 mmol/L (ref 20–29)
Calcium: 9.7 mg/dL (ref 8.7–10.2)
Chloride: 103 mmol/L (ref 96–106)
Creatinine, Ser: 0.93 mg/dL (ref 0.57–1.00)
Globulin, Total: 2.4 g/dL (ref 1.5–4.5)
Glucose: 90 mg/dL (ref 70–99)
Potassium: 4.7 mmol/L (ref 3.5–5.2)
Sodium: 143 mmol/L (ref 134–144)
Total Protein: 7.2 g/dL (ref 6.0–8.5)
eGFR: 76 mL/min/{1.73_m2} (ref >59)

## 2021-06-26 MED ORDER — VITAMIN D (ERGOCALCIFEROL) 1.25 MG (50000 UNIT) PO CAPS: 50000.0000 [IU] | ORAL_CAPSULE | ORAL | 3 refills | Status: DC

## 2021-11-27 IMAGING — MG MM DIGITAL SCREENING BILAT W/ TOMO AND CAD
6 of 10 series · 6 of 30 positions shown · non-contrast
Comparison: Previous exam(s).

CLINICAL DATA: Screening.

EXAM:
DIGITAL SCREENING BILATERAL MAMMOGRAM WITH TOMOSYNTHESIS AND CAD
TECHNIQUE: Bilateral screening digital craniocaudal and mediolateral oblique
mammograms were obtained. Bilateral screening digital breast
tomosynthesis was performed. The images were evaluated with
computer-aided detection.

[R XCCL synth-2D]
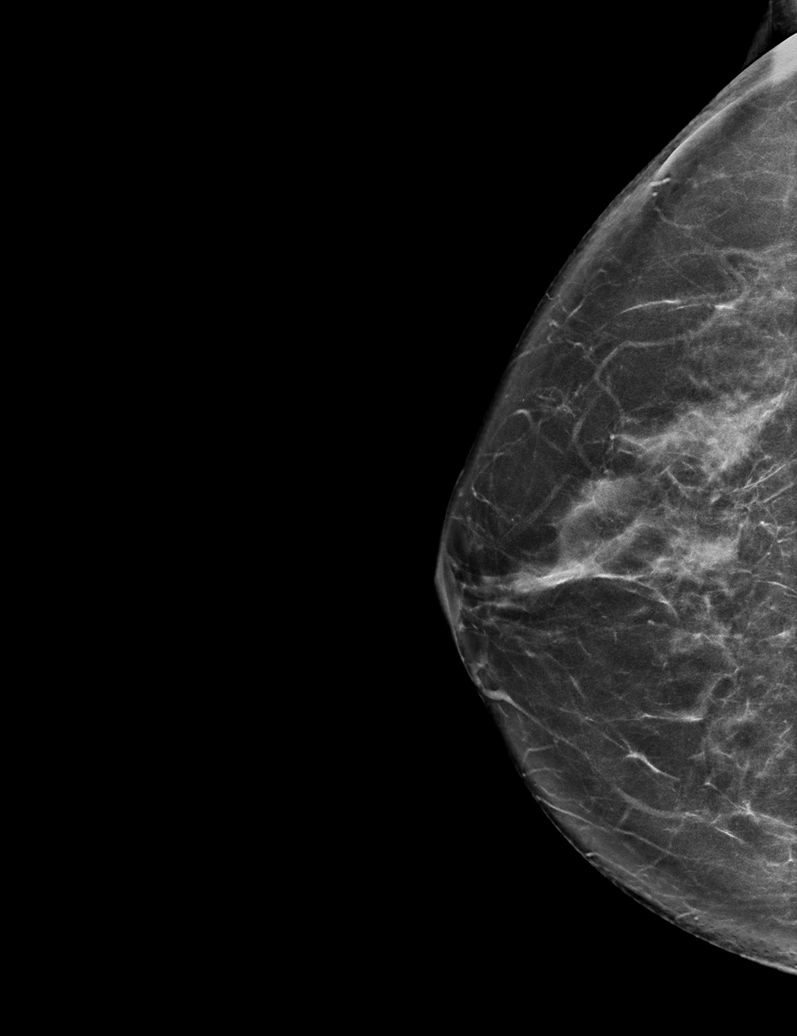

[L MLO synth-2D]
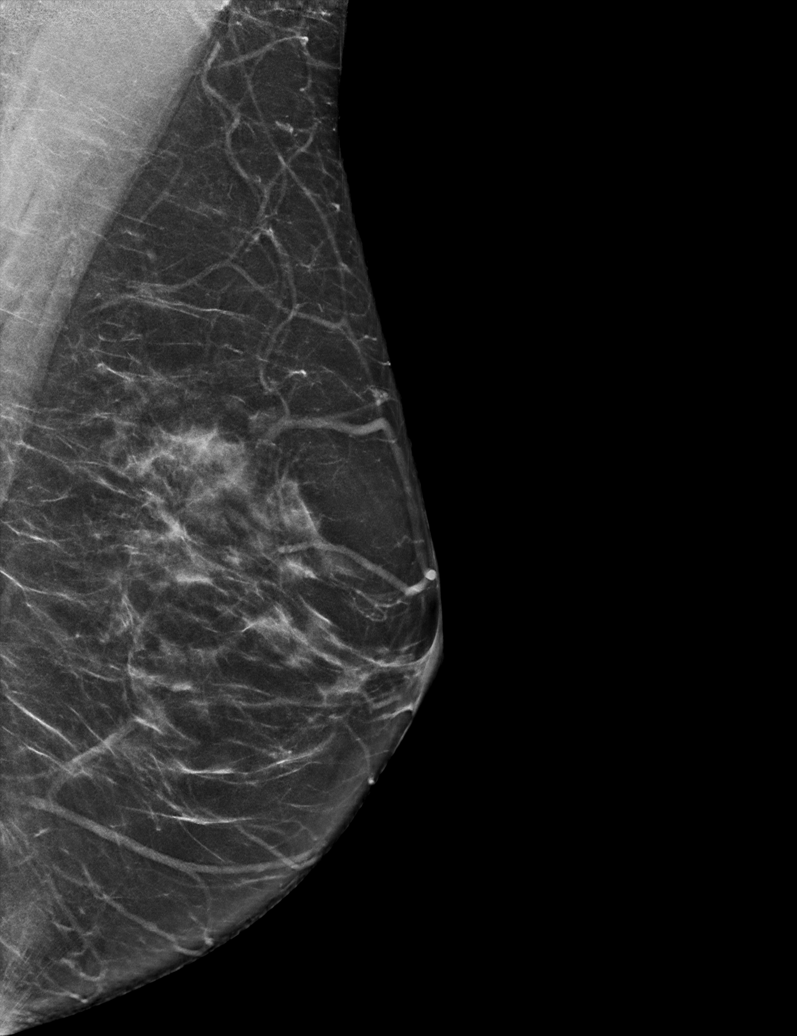

[R MLO synth-2D]
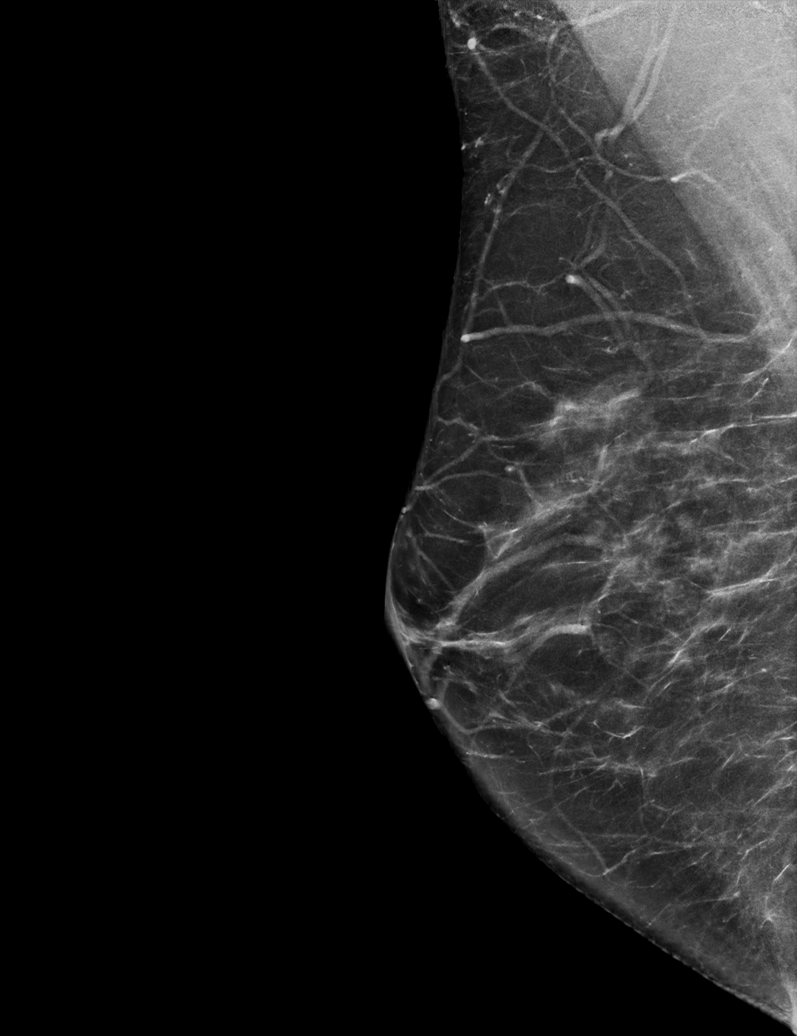

[R CC synth-2D]
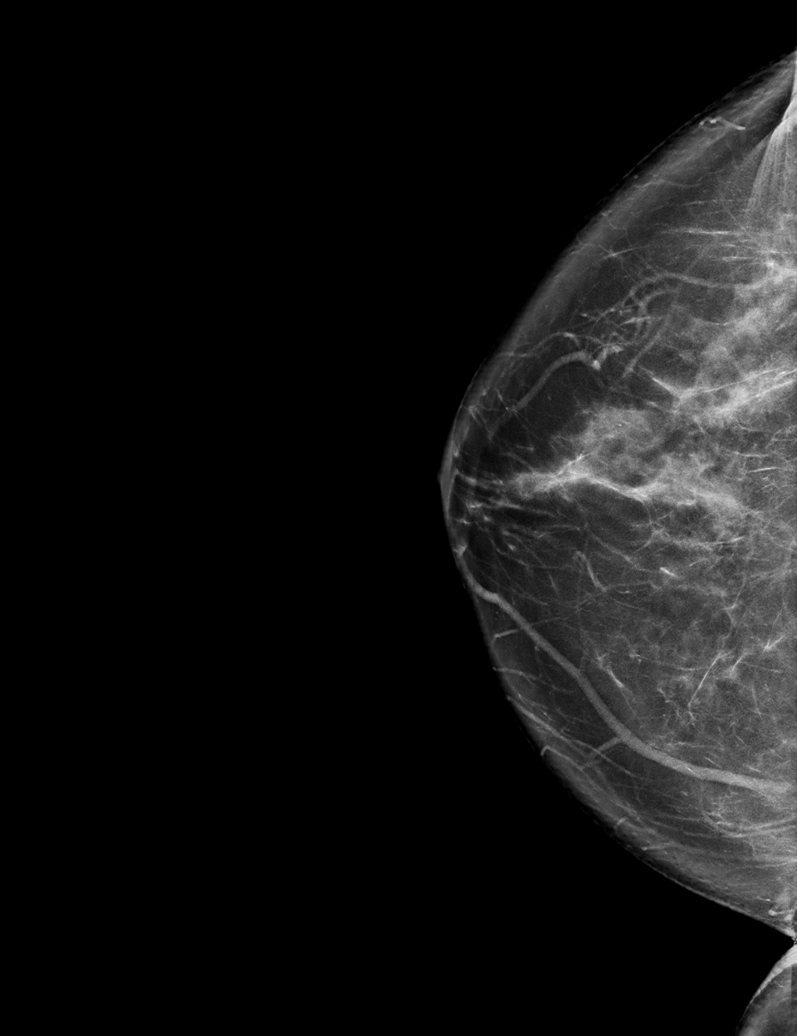

[L CC synth-2D]
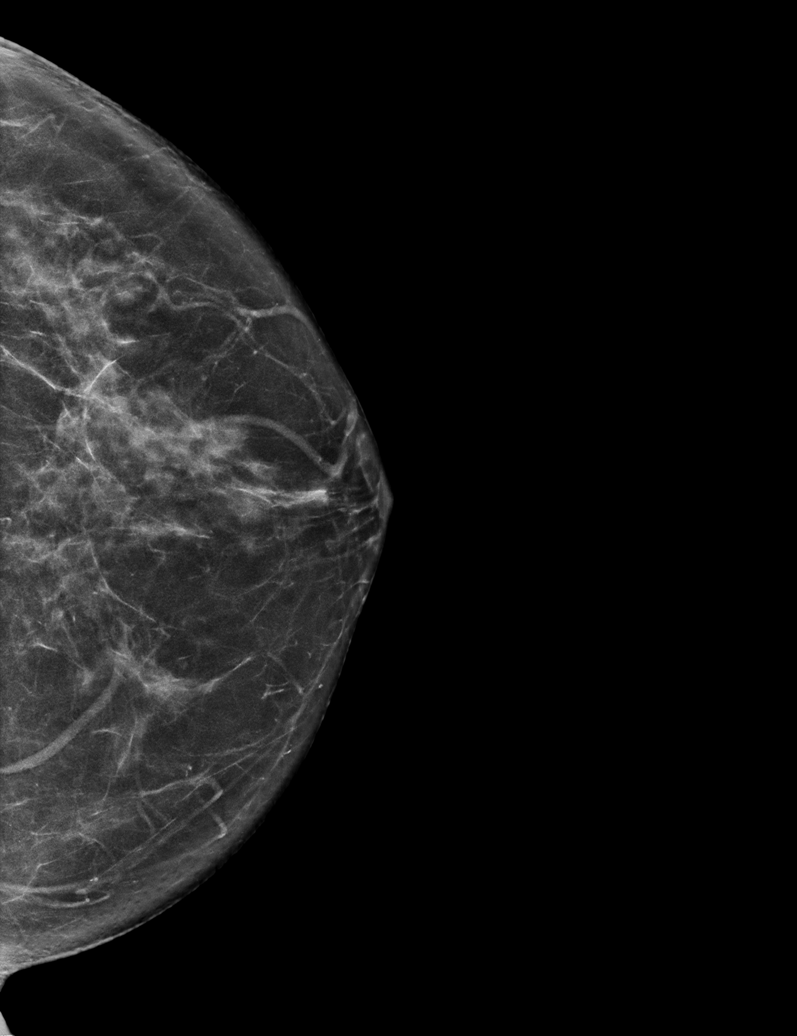

[L CC tomo · tomo slice 33/64.0]
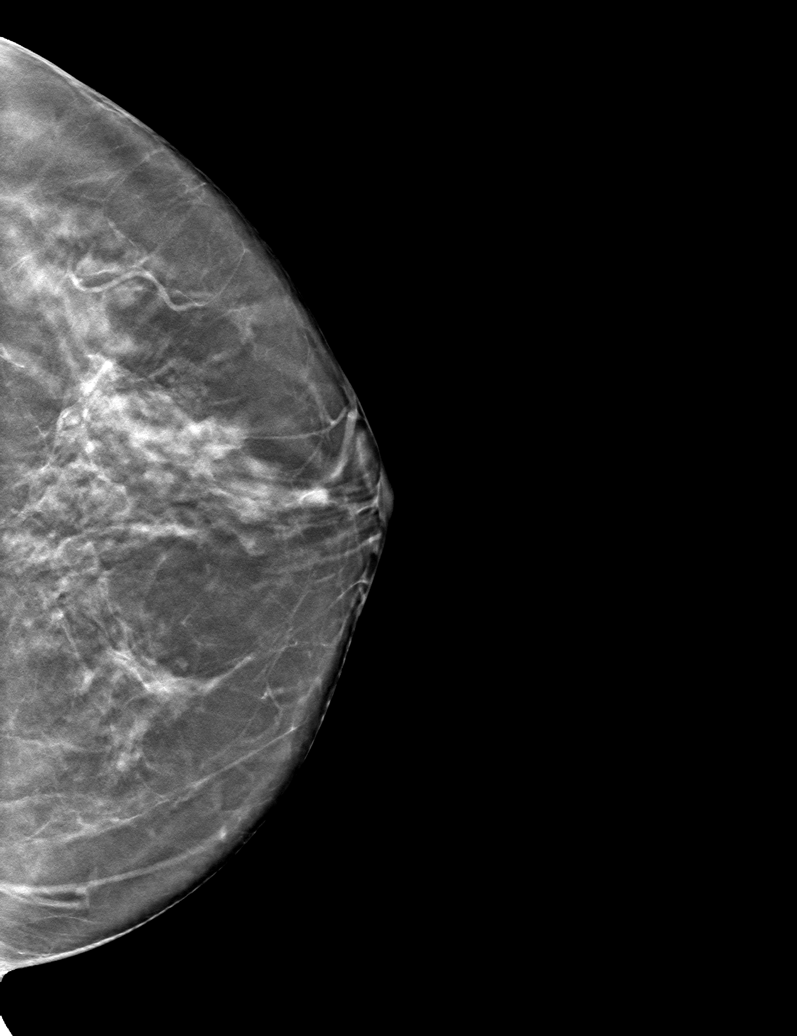

[6 of 30 positions shown; findings below may reference images not displayed]

ACR Breast Density Category c: The breast tissue is heterogeneously
dense, which may obscure small masses.
FINDINGS: There are no findings suspicious for malignancy.
IMPRESSION: No mammographic evidence of malignancy. A result letter of this
screening mammogram will be mailed directly to the patient.

RECOMMENDATION:
Screening mammogram in one year. (Code:[id number])

BI-RADS CATEGORY  1: Negative.

## 2021-12-24 ENCOUNTER — Ambulatory Visit: Payer: Managed Care, Other (non HMO) | Admitting: Nurse Practitioner

## 2021-12-24 ENCOUNTER — Encounter: Payer: Self-pay | Admitting: Nurse Practitioner

## 2021-12-24 VITALS — BP 132/68 | HR 80 | Temp 97.2°F | Ht 68.0 in | Wt 178.0 lb

## 2021-12-24 DIAGNOSIS — K219 Gastro-esophageal reflux disease without esophagitis: Secondary | ICD-10-CM | POA: Diagnosis not present

## 2021-12-24 DIAGNOSIS — Z6827 Body mass index (BMI) 27.0-27.9, adult: Secondary | ICD-10-CM

## 2021-12-24 DIAGNOSIS — Z1239 Encounter for other screening for malignant neoplasm of breast: Secondary | ICD-10-CM

## 2021-12-24 DIAGNOSIS — M1611 Unilateral primary osteoarthritis, right hip: Secondary | ICD-10-CM | POA: Insufficient documentation

## 2021-12-24 DIAGNOSIS — J302 Other seasonal allergic rhinitis: Secondary | ICD-10-CM

## 2021-12-24 DIAGNOSIS — F17211 Nicotine dependence, cigarettes, in remission: Secondary | ICD-10-CM

## 2021-12-24 DIAGNOSIS — E559 Vitamin D deficiency, unspecified: Secondary | ICD-10-CM

## 2021-12-24 MED ORDER — MONTELUKAST SODIUM 10 MG PO TABS: 10.0000 mg | ORAL_TABLET | Freq: Every day | ORAL | 3 refills | Status: AC

## 2021-12-24 MED ORDER — MELOXICAM 15 MG PO TABS: 15.0000 mg | ORAL_TABLET | Freq: Every day | ORAL | 2 refills | Status: DC

## 2021-12-24 NOTE — Patient Instructions (Addendum)
Increase Mobic to 15 mg daily, with food Take Tylenol arthritis at night for hip pain We will call you with lab results and mammogram for August 30th, 2023  Follow-up in 86-months     Osteoarthritis  Osteoarthritis is a type of arthritis. It refers to joint pain or joint disease. Osteoarthritis affects tissue that covers the ends of bones in joints (cartilage). Cartilage acts as a cushion between the bones and helps them move smoothly. Osteoarthritis occurs when cartilage in the joints gets worn down. Osteoarthritis is sometimes called "wear and tear" arthritis. Osteoarthritis is the most common form of arthritis. It often occurs in older people. It is a condition that gets worse over time. The joints most often affected by this condition are in the fingers, toes, hips, knees, and spine, including the neck and lower back. What are the causes? This condition is caused by the wearing down of cartilage that covers the ends of bones. What increases the risk? The following factors may make you more likely to develop this condition: Being age 49 or older. Obesity. Overuse of joints. Past injury of a joint. Past surgery on a joint. Family history of osteoarthritis. What are the signs or symptoms? The main symptoms of this condition are pain, swelling, and stiffness in the joint. Other symptoms may include: An enlarged joint. More pain and further damage caused by small pieces of bone or cartilage that break off and float inside of the joint. Small deposits of bone (osteophytes) that grow on the edges of the joint. A grating or scraping feeling inside the joint when you move it. Popping or creaking sounds when you move. Difficulty walking or exercising. An inability to grip items, twist your hand(s), or control the movements of your hands and fingers. How is this diagnosed? This condition may be diagnosed based on: Your medical history. A physical exam. Your symptoms. X-rays of the  affected joint(s). Blood tests to rule out other types of arthritis. How is this treated? There is no cure for this condition, but treatment can help control pain and improve joint function. Treatment may include a combination of therapies, such as: Pain relief techniques, such as: Applying heat and cold to the joint. Massage. A form of talk therapy called cognitive behavioral therapy (CBT). This therapy helps you set goals and follow up on the changes that you make. Medicines for pain and inflammation. The medicines can be taken by mouth or applied to the skin. They include: NSAIDs, such as ibuprofen. Prescription medicines. Strong anti-inflammatory medicines (corticosteroids). Certain nutritional supplements. A prescribed exercise program. You may work with a physical therapist. Assistive devices, such as a brace, wrap, splint, specialized glove, or cane. A weight control plan. Surgery, such as: An osteotomy. This is done to reposition the bones and relieve pain or to remove loose pieces of bone and cartilage. Joint replacement surgery. You may need this surgery if you have advanced osteoarthritis. Follow these instructions at home: Activity Rest your affected joints as told by your health care provider. Exercise as told by your health care provider. He or she may recommend specific types of exercise, such as: Strengthening exercises. These are done to strengthen the muscles that support joints affected by arthritis. Aerobic activities. These are exercises, such as brisk walking or water aerobics, that increase your heart rate. Range-of-motion activities. These help your joints move more easily. Balance and agility exercises. Managing pain, stiffness, and swelling     If directed, apply heat to the affected area as  often as told by your health care provider. Use the heat source that your health care provider recommends, such as a moist heat pack or a heating pad. If you have a  removable assistive device, remove it as told by your health care provider. Place a towel between your skin and the heat source. If your health care provider tells you to keep the assistive device on while you apply heat, place a towel between the assistive device and the heat source. Leave the heat on for 20-30 minutes. Remove the heat if your skin turns bright red. This is especially important if you are unable to feel pain, heat, or cold. You may have a greater risk of getting burned. If directed, put ice on the affected area. To do this: If you have a removable assistive device, remove it as told by your health care provider. Put ice in a plastic bag. Place a towel between your skin and the bag. If your health care provider tells you to keep the assistive device on during icing, place a towel between the assistive device and the bag. Leave the ice on for 20 minutes, 2-3 times a day. Move your fingers or toes often to reduce stiffness and swelling. Raise (elevate) the injured area above the level of your heart while you are sitting or lying down. General instructions Take over-the-counter and prescription medicines only as told by your health care provider. Maintain a healthy weight. Follow instructions from your health care provider for weight control. Do not use any products that contain nicotine or tobacco, such as cigarettes, e-cigarettes, and chewing tobacco. If you need help quitting, ask your health care provider. Use assistive devices as told by your health care provider. Keep all follow-up visits as told by your health care provider. This is important. Where to find more information [employer] of Arthritis and Musculoskeletal and Skin Diseases: www.niams.http://www.myers.net/ [employer] on Aging: https://walker.com/ American College of Rheumatology: www.rheumatology.org Contact a health care provider if: You have redness, swelling, or a feeling of warmth in a joint that gets  worse. You have a fever along with joint or muscle aches. You develop a rash. You have trouble doing your normal activities. Get help right away if: You have pain that gets worse and is not relieved by pain medicine. Summary Osteoarthritis is a type of arthritis that affects tissue covering the ends of bones in joints (cartilage). This condition is caused by the wearing down of cartilage that covers the ends of bones. The main symptom of this condition is pain, swelling, and stiffness in the joint. There is no cure for this condition, but treatment can help control pain and improve joint function. This information is not intended to replace advice given to you by your health care provider. Make sure you discuss any questions you have with your health care provider. Document Revised: 07/02/2019 Document Reviewed: 07/02/2019 Elsevier Patient Education  2023 [employer] Inc.    Hip Pain The hip is the joint between the upper legs and the lower pelvis. The bones, cartilage, tendons, and muscles of your hip joint support your body and allow you to move around. Hip pain can range from a minor ache to severe pain in one or both of your hips. The pain may be felt on the inside of the hip joint near the groin, or on the outside near the buttocks and upper thigh. You may also have swelling or stiffness in your hip area. Follow these instructions at home: Managing pain,  stiffness, and swelling     If directed, put ice on the painful area. To do this: Put ice in a plastic bag. Place a towel between your skin and the bag. Leave the ice on for 20 minutes, 2-3 times a day. If directed, apply heat to the affected area as often as told by your health care provider. Use the heat source that your health care provider recommends, such as a moist heat pack or a heating pad. Place a towel between your skin and the heat source. Leave the heat on for 20-30 minutes. Remove the heat if your skin turns bright  red. This is especially important if you are unable to feel pain, heat, or cold. You may have a greater risk of getting burned. Activity Do exercises as told by your health care provider. Avoid activities that cause pain. General instructions  Take over-the-counter and prescription medicines only as told by your health care provider. Keep a journal of your symptoms. Write down: How often you have hip pain. The location of your pain. What the pain feels like. What makes the pain worse. Sleep with a pillow between your legs on your most comfortable side. Keep all follow-up visits as told by your health care provider. This is important. Contact a health care provider if: You cannot put weight on your leg. Your pain or swelling continues or gets worse after one week. It gets harder to walk. You have a fever. Get help right away if: You fall. You have a sudden increase in pain and swelling in your hip. Your hip is red or swollen or very tender to touch. Summary Hip pain can range from a minor ache to severe pain in one or both of your hips. The pain may be felt on the inside of the hip joint near the groin, or on the outside near the buttocks and upper thigh. Avoid activities that cause pain. Write down how often you have hip pain, the location of the pain, what makes it worse, and what it feels like. This information is not intended to replace advice given to you by your health care provider. Make sure you discuss any questions you have with your health care provider. Document Revised: 11/20/2018 Document Reviewed: 11/20/2018 Elsevier Patient Education  2023 [employer].

## 2021-12-24 NOTE — Progress Notes (Signed)
Subjective:  Patient ID: Martha Burgess, female    DOB: 13-Jan-1971  Age: 51 y.o. MRN: [id number]  Chief Complaint  Patient presents with   Gastroesophageal Reflux   vitamin d def   HPI:  Patient presents today for 6 month follow up GERD and Vit D deficiency. She quit smoking last October 2022. She has chronic right hip pain. 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 Treatment has included Mobic 7.5 mg, states she has taken medication twice daily to alleviate pain. She has declined ortho referral at this time.   GERD, Follow up:   The patient was last seen for GERD 6 months ago. Current treatment includes avoiding foods that trigger GERD She reports good compliance with treatment. She is not having side effects. She is NOT experiencing difficulty swallowing   Vitamin D deficiency, follow-up  Lab Results  Component Value Date   VD25OH 23.6 (L) 06/25/2021   VD25OH 21.5 (L) 11/27/2020   CALCIUM 9.7 06/25/2021   CALCIUM 9.7 12/25/2020   Wt Readings from Last 3 Encounters:  12/24/21 178 lb (80.7 kg)  06/25/21 166 lb (75.3 kg)  01/13/21 173 lb 3.2 oz (78.6 kg)    She was last seen for vitamin D deficiency 6 months ago.  Management since that visit includes Vit D rich diet and Vit D 50, 000 U weekly. She reports excellent compliance with treatment. She is not having side effects.    --------------------------------------------------------------------------------------------------- d Current Outpatient Medications on File Prior to Visit  Medication Sig Dispense Refill   albuterol (VENTOLIN HFA) 108 (90 Base) MCG/ACT inhaler Inhale two puffs every 4-6 hours if needed for coughing, wheezing, or shortness of breath. 1 each 1   Folic Acid-Vit B6-Vit B12 0.5-5-0.2 MG TABS Take 1 each by mouth daily. 30 tablet 2   meloxicam (MOBIC) 7.5 MG tablet Take 1 tablet (7.5 mg total) by mouth 2 (two) times daily. 180 tablet 1   montelukast (SINGULAIR) 10 MG tablet Take 1 tablet (10  mg total) by mouth at bedtime. (Patient not taking: Reported on 06/25/2021) 30 tablet 3   pantoprazole (PROTONIX) 40 MG tablet Take 1 tablet (40 mg total) by mouth daily. 90 tablet 3   Vitamin D, Ergocalciferol, (DRISDOL) 1.25 MG (50000 UNIT) CAPS capsule Take 1 capsule (50,000 Units total) by mouth every 7 (seven) days. 12 capsule 3   No current facility-administered medications on file prior to visit.   Past Medical History:  Diagnosis Date   Anemia    History of kidney stones    Hx of migraines    Seasonal allergies    Past Surgical History:  Procedure Laterality Date   Left Foot Surgery- 2020     4 screws and 1 staple placed in foot.   Right Shoulder Surgery- Torn Rotator Cuff/Tendon stretched- 2016     TUBAL LIGATION      Family History  Problem Relation Age of Onset   Heart attack Mother    Diabetes Mother    Hypertension Mother    Hypotension Father    Kidney failure Sister    Social History   Socioeconomic History   Marital status: Married    Spouse name: Not on file   Number of children: 3   Years of education: Not on file   Highest education level: Not on file  Occupational History   Not on file  Tobacco Use   Smoking status: Former    Packs/day: 1.00    Types: Cigarettes    Quit  date: 2022    Years since quitting: 1.4   Smokeless tobacco: Never  Vaping Use   Vaping Use: Never used  Substance and Sexual Activity   Alcohol use: Never   Drug use: Never   Sexual activity: Not on file  Other Topics Concern   Not on file  Social History Narrative   Not on file   Social Determinants of Health   Financial Resource Strain: Not on file  Food Insecurity: Not on file  Transportation Needs: Not on file  Physical Activity: Not on file  Stress: Not on file  Social Connections: Not on file    Review of Systems  Constitutional:  Negative for chills, fatigue and fever.  HENT:  Negative for congestion, ear pain, rhinorrhea and sore throat.   Respiratory:   Negative for cough and shortness of breath.   Cardiovascular:  Negative for chest pain.  Gastrointestinal:  Negative for abdominal pain, constipation, diarrhea, nausea and vomiting.  Genitourinary:  Negative for dysuria and urgency.  Musculoskeletal:  Positive for arthralgias (right hip, left foot). Negative for back pain and myalgias.  Neurological:  Negative for dizziness, weakness, light-headedness and headaches.  Psychiatric/Behavioral:  Negative for dysphoric mood. The patient is not nervous/anxious.      Objective:  BP 132/68   Pulse 80   Temp (!) 97.2 F (36.2 C)   Ht 5\' 8"  (1.727 m)   Wt 178 lb (80.7 kg)   SpO2 98%   BMI 27.06 kg/m       12/24/2021    7:52 AM 06/25/2021    3:40 PM 01/13/2021    9:10 AM  BP/Weight  Systolic BP  116 01/15/2021  Diastolic BP  78 70  Wt. (Lbs) 178 166 173.2  BMI 27.06 kg/m2 25.24 kg/m2 26.33 kg/m2    Physical Exam Vitals reviewed.  HENT:     Right Ear: Tympanic membrane normal.     Left Ear: Tympanic membrane normal.  Cardiovascular:     Rate and Rhythm: Normal rate and regular rhythm.     Pulses: Normal pulses.     Heart sounds: Normal heart sounds.  Pulmonary:     Effort: Pulmonary effort is normal.     Breath sounds: Normal breath sounds.  Musculoskeletal:        General: Tenderness (right hip, left foot) present.  Skin:    General: Skin is warm and dry.     Capillary Refill: Capillary refill takes less than 2 seconds.  Neurological:     General: No focal deficit present.     Mental Status: She is alert and oriented to person, place, and time.    Lab Results  Component Value Date   WBC 7.1 06/25/2021   HGB 13.7 06/25/2021   HCT 40.4 06/25/2021   PLT 312 06/25/2021   GLUCOSE 90 06/25/2021   CHOL 158 03/16/2021   TRIG 78 03/16/2021   HDL 47 03/16/2021   LDLCALC 96 03/16/2021   ALT 19 06/25/2021   AST 21 06/25/2021   NA 143 06/25/2021   K 4.7 06/25/2021   CL 103 06/25/2021   CREATININE 0.93 06/25/2021   BUN 8  06/25/2021   CO2 24 06/25/2021   TSH 2.160 11/27/2020      Assessment & Plan:   1. Vitamin D deficiency-well controlled - VITAMIN D 25 Hydroxy (Vit-D Deficiency, Fractures) -continue Vit D 50, 000 U weekly  2. Seasonal allergies-well controlled - montelukast (SINGULAIR) 10 MG tablet; Take 1 tablet (10 mg total) by mouth  at bedtime.  Dispense: 90 tablet; Refill: 3  3. Gastroesophageal reflux disease, unspecified whether esophagitis present-well controlled -continue Protonix 40 mg QD -avoid foods that trigger GERD  4. Encounter for other screening for malignant neoplasm of breast - MM DIGITAL SCREENING BILATERAL; Future  5. Primary osteoarthritis of right hip - meloxicam (MOBIC) 15 MG tablet; Take 1 tablet (15 mg total) by mouth daily.  Dispense: 90 tablet; Refill: 2 -tylenol arthritis OTC as directed  6. BMI 27.0-27.9,adult - CBC with Differential/Platelet - Comprehensive metabolic panel - Lipid panel - TSH  7. Cigarette nicotine dependence in remission     Increase Mobic to 15 mg daily, with food Take Tylenol arthritis at night for hip pain We will call you with lab results and mammogram for August 30th, 2023  Follow-up in 796-months  Follow-up: 466-months, fasting  An After Visit Summary was printed and given to the patient.  I, Janie MorningShannon J Lorance Pickeral, NP, have reviewed all documentation for this visit. The documentation on 12/24/21 for the exam, diagnosis, procedures, and orders are all accurate and complete.    Signed, Janie MorningShannon J Rogan Wigley, NP [facility] [phone]

## 2021-12-25 LAB — CBC WITH DIFFERENTIAL/PLATELET
Basophils Absolute: 0.1 10*3/uL (ref 0.0–0.2)
Basos: 1 %
EOS (ABSOLUTE): 0.2 10*3/uL (ref 0.0–0.4)
Eos: 4 %
Hematocrit: 43.3 % (ref 34.0–46.6)
Hemoglobin: 14.7 g/dL (ref 11.1–15.9)
Immature Grans (Abs): 0 10*3/uL (ref 0.0–0.1)
Immature Granulocytes: 0 %
Lymphocytes Absolute: 1.4 10*3/uL (ref 0.7–3.1)
Lymphs: 24 %
MCH: 29.9 pg (ref 26.6–33.0)
MCHC: 33.9 g/dL (ref 31.5–35.7)
MCV: 88 fL (ref 79–97)
Monocytes Absolute: 0.4 10*3/uL (ref 0.1–0.9)
Monocytes: 8 %
Neutrophils Absolute: 3.6 10*3/uL (ref 1.4–7.0)
Neutrophils: 63 %
Platelets: 290 10*3/uL (ref 150–450)
RBC: 4.92 x10E6/uL (ref 3.77–5.28)
RDW: 12.9 % (ref 11.7–15.4)
WBC: 5.6 10*3/uL (ref 3.4–10.8)

## 2021-12-25 LAB — LIPID PANEL
Chol/HDL Ratio: 3.5 ratio (ref 0.0–4.4)
Cholesterol, Total: 209 mg/dL — ABNORMAL HIGH (ref 100–199)
HDL: 60 mg/dL (ref >39)
LDL Chol Calc (NIH): 138 mg/dL — ABNORMAL HIGH (ref 0–99)
Triglycerides: 61 mg/dL (ref 0–149)
VLDL Cholesterol Cal: 11 mg/dL (ref 5–40)

## 2021-12-25 LAB — COMPREHENSIVE METABOLIC PANEL
ALT: 15 IU/L (ref 0–32)
AST: 15 IU/L (ref 0–40)
Albumin/Globulin Ratio: 1.7 (ref 1.2–2.2)
Albumin: 4.5 g/dL (ref 3.8–4.8)
Alkaline Phosphatase: 94 IU/L (ref 44–121)
BUN/Creatinine Ratio: 16 (ref 9–23)
BUN: 17 mg/dL (ref 6–24)
Bilirubin Total: 0.5 mg/dL (ref 0.0–1.2)
CO2: 24 mmol/L (ref 20–29)
Calcium: 10.2 mg/dL (ref 8.7–10.2)
Chloride: 105 mmol/L (ref 96–106)
Creatinine, Ser: 1.06 mg/dL — ABNORMAL HIGH (ref 0.57–1.00)
Globulin, Total: 2.6 g/dL (ref 1.5–4.5)
Glucose: 96 mg/dL (ref 70–99)
Potassium: 4.8 mmol/L (ref 3.5–5.2)
Sodium: 144 mmol/L (ref 134–144)
Total Protein: 7.1 g/dL (ref 6.0–8.5)
eGFR: 64 mL/min/{1.73_m2} (ref >59)

## 2021-12-25 LAB — CARDIOVASCULAR RISK ASSESSMENT

## 2021-12-25 LAB — VITAMIN D 25 HYDROXY (VIT D DEFICIENCY, FRACTURES): Vit D, 25-Hydroxy: 21.2 ng/mL — ABNORMAL LOW (ref 30.0–100.0)

## 2021-12-25 LAB — TSH: TSH: 2.43 u[IU]/mL (ref 0.450–4.500)

## 2021-12-29 ENCOUNTER — Other Ambulatory Visit: Payer: Self-pay

## 2021-12-29 DIAGNOSIS — E559 Vitamin D deficiency, unspecified: Secondary | ICD-10-CM

## 2021-12-29 MED ORDER — VITAMIN D (ERGOCALCIFEROL) 1.25 MG (50000 UNIT) PO CAPS: 50000.0000 [IU] | ORAL_CAPSULE | ORAL | 3 refills | Status: DC

## 2021-12-31 ENCOUNTER — Ambulatory Visit: Payer: Managed Care, Other (non HMO) | Admitting: Nurse Practitioner

## 2022-03-17 ENCOUNTER — Other Ambulatory Visit: Payer: Self-pay | Admitting: Nurse Practitioner

## 2022-03-17 ENCOUNTER — Ambulatory Visit
Admission: RE | Admit: 2022-03-17 | Discharge: 2022-03-17 | Disposition: A | Discharge status: Home / Self Care | Payer: Managed Care, Other (non HMO) | Source: Ambulatory Visit | Attending: Nurse Practitioner | Admitting: Nurse Practitioner

## 2022-03-17 DIAGNOSIS — Z1239 Encounter for other screening for malignant neoplasm of breast: Secondary | ICD-10-CM

## 2022-03-21 ENCOUNTER — Other Ambulatory Visit: Payer: Self-pay | Admitting: Nurse Practitioner

## 2022-03-21 DIAGNOSIS — R928 Other abnormal and inconclusive findings on diagnostic imaging of breast: Secondary | ICD-10-CM

## 2022-03-23 ENCOUNTER — Other Ambulatory Visit: Payer: Self-pay | Admitting: Nurse Practitioner

## 2022-03-23 DIAGNOSIS — R928 Other abnormal and inconclusive findings on diagnostic imaging of breast: Secondary | ICD-10-CM

## 2022-04-08 ENCOUNTER — Ambulatory Visit
Admission: RE | Admit: 2022-04-08 | Discharge: 2022-04-08 | Disposition: A | Discharge status: Home / Self Care | Payer: Managed Care, Other (non HMO) | Source: Ambulatory Visit | Attending: Nurse Practitioner | Admitting: Nurse Practitioner

## 2022-04-08 ENCOUNTER — Ambulatory Visit: Payer: Managed Care, Other (non HMO)

## 2022-04-08 DIAGNOSIS — R928 Other abnormal and inconclusive findings on diagnostic imaging of breast: Secondary | ICD-10-CM

## 2022-06-28 ENCOUNTER — Encounter: Payer: Self-pay | Admitting: Nurse Practitioner

## 2022-06-28 ENCOUNTER — Other Ambulatory Visit: Payer: Self-pay | Admitting: Nurse Practitioner

## 2022-06-28 ENCOUNTER — Ambulatory Visit: Payer: Managed Care, Other (non HMO) | Admitting: Nurse Practitioner

## 2022-06-28 VITALS — BP 128/82 | HR 80 | Temp 97.4°F | Ht 69.0 in | Wt 178.0 lb

## 2022-06-28 DIAGNOSIS — E785 Hyperlipidemia, unspecified: Secondary | ICD-10-CM | POA: Diagnosis not present

## 2022-06-28 DIAGNOSIS — Z6826 Body mass index (BMI) 26.0-26.9, adult: Secondary | ICD-10-CM

## 2022-06-28 DIAGNOSIS — E559 Vitamin D deficiency, unspecified: Secondary | ICD-10-CM

## 2022-06-28 DIAGNOSIS — Z23 Encounter for immunization: Secondary | ICD-10-CM | POA: Diagnosis not present

## 2022-06-28 DIAGNOSIS — J3089 Other allergic rhinitis: Secondary | ICD-10-CM

## 2022-06-28 DIAGNOSIS — K219 Gastro-esophageal reflux disease without esophagitis: Secondary | ICD-10-CM

## 2022-06-28 DIAGNOSIS — J302 Other seasonal allergic rhinitis: Secondary | ICD-10-CM

## 2022-06-28 DIAGNOSIS — M1611 Unilateral primary osteoarthritis, right hip: Secondary | ICD-10-CM

## 2022-06-28 DIAGNOSIS — Z8349 Family history of other endocrine, nutritional and metabolic diseases: Secondary | ICD-10-CM

## 2022-06-28 MED ORDER — PANTOPRAZOLE SODIUM 40 MG PO TBEC: 40.0000 mg | DELAYED_RELEASE_TABLET | Freq: Every day | ORAL | 3 refills | Status: AC

## 2022-06-28 MED ORDER — VITAMIN D (ERGOCALCIFEROL) 1.25 MG (50000 UNIT) PO CAPS
50000.0000 [IU] | ORAL_CAPSULE | ORAL | 3 refills | Status: AC
Start: 2022-06-28 — End: ?

## 2022-06-28 NOTE — Progress Notes (Deleted)
Subjective:  Patient ID: Martha Burgess, female    DOB: 1971-07-02  Age: 51 y.o. MRN: [id number]  Chief Complaint  Patient presents with   Follow-up    77M Vitamin D deficiency    HPI   Current Outpatient Medications on File Prior to Visit  Medication Sig Dispense Refill   albuterol (VENTOLIN HFA) 108 (90 Base) MCG/ACT inhaler Inhale two puffs every 4-6 hours if needed for coughing, wheezing, or shortness of breath. 1 each 1   Folic Acid-Vit B6-Vit B12 0.5-5-0.2 MG TABS Take 1 each by mouth daily. 30 tablet 2   montelukast (SINGULAIR) 10 MG tablet Take 1 tablet (10 mg total) by mouth at bedtime. 90 tablet 3   pantoprazole (PROTONIX) 40 MG tablet Take 1 tablet (40 mg total) by mouth daily. 90 tablet 3   Vitamin D, Ergocalciferol, (DRISDOL) 1.25 MG (50000 UNIT) CAPS capsule Take 1 capsule (50,000 Units total) by mouth every 7 (seven) days. 12 capsule 3   No current facility-administered medications on file prior to visit.   Past Medical History:  Diagnosis Date   Anemia    History of kidney stones    Hx of migraines    Seasonal allergies    Past Surgical History:  Procedure Laterality Date   Left Foot Surgery- 2020     4 screws and 1 staple placed in foot.   Right Shoulder Surgery- Torn Rotator Cuff/Tendon stretched- 2016     TUBAL LIGATION      Family History  Problem Relation Age of Onset   Heart attack Mother    Diabetes Mother    Hypertension Mother    Hypotension Father    Kidney failure Sister    Breast cancer Neg Hx    Social History   Socioeconomic History   Marital status: Married    Spouse name: Not on file   Number of children: 3   Years of education: Not on file   Highest education level: Not on file  Occupational History   Not on file  Tobacco Use   Smoking status: Former    Packs/day: 1.00    Types: Cigarettes    Quit date: 2022    Years since quitting: 1.9   Smokeless tobacco: Never  Vaping Use   Vaping Use: Never used  Substance and Sexual  Activity   Alcohol use: Never   Drug use: Never   Sexual activity: Not on file  Other Topics Concern   Not on file  Social History Narrative   Not on file   Social Determinants of Health   Financial Resource Strain: Low Risk  (12/24/2021)   Overall Financial Resource Strain (CARDIA)    Difficulty of Paying Living Expenses: Not hard at all  Food Insecurity: No Food Insecurity (12/24/2021)   Hunger Vital Sign    Worried About Running Out of Food in the Last Year: Never true    Ran Out of Food in the Last Year: Never true  Transportation Needs: No Transportation Needs (12/24/2021)   PRAPARE - Administrator, Civil Service (Medical): No    Lack of Transportation (Non-Medical): No  Physical Activity: Inactive (12/24/2021)   Exercise Vital Sign    Days of Exercise per Week: 0 days    Minutes of Exercise per Session: 0 min  Stress: No Stress Concern Present (12/24/2021)   [employer] of Occupational Health - Occupational Stress Questionnaire    Feeling of Stress : Not at all  Social Connections: Moderately Isolated (  12/24/2021)   Social Connection and Isolation Panel [NHANES]    Frequency of Communication with Friends and Family: More than three times a week    Frequency of Social Gatherings with Friends and Family: More than three times a week    Attends Religious Services: Never    Database administrator or Organizations: No    Attends Banker Meetings: Never    Marital Status: Married    Review of Systems  Constitutional:  Negative for fatigue.  HENT:  Negative for congestion, ear pain and sore throat.   Respiratory:  Negative for cough and shortness of breath.   Cardiovascular:  Negative for chest pain.  Gastrointestinal:  Negative for abdominal pain, constipation, diarrhea, nausea and vomiting.  Genitourinary:  Negative for dysuria, frequency and urgency.  Musculoskeletal:  Negative for arthralgias, back pain and myalgias.  Neurological:  Negative for  dizziness and headaches.  Psychiatric/Behavioral:  Negative for agitation and sleep disturbance. The patient is not nervous/anxious.      Objective:  Pulse 80   Temp (!) 97.4 F (36.3 C) (Temporal)   Ht 5\' 9"  (1.753 m)   Wt 178 lb (80.7 kg)   SpO2 98%   BMI 26.29 kg/m      06/28/2022    7:30 AM 12/24/2021    7:52 AM 06/25/2021    3:40 PM  BP/Weight  Systolic BP  132 14/02/2021  Diastolic BP  68 78  Wt. (Lbs) 178 178 166  BMI 26.29 kg/m2 27.06 kg/m2 25.24 kg/m2    Physical Exam Vitals reviewed.  Constitutional:      Appearance: Normal appearance.  Neck:     Vascular: No carotid bruit.  Cardiovascular:     Rate and Rhythm: Normal rate and regular rhythm.  Pulmonary:     Effort: Pulmonary effort is normal.     Breath sounds: Normal breath sounds.  Abdominal:     General: Bowel sounds are normal.  Musculoskeletal:        General: Normal range of motion.     Cervical back: Normal range of motion.  Skin:    General: Skin is warm.  Neurological:     Mental Status: She is alert.  Psychiatric:        Mood and Affect: Mood normal.        Behavior: Behavior normal.     Diabetic Foot Exam - Simple   No data filed      Lab Results  Component Value Date   WBC 5.6 12/24/2021   HGB 14.7 12/24/2021   HCT 43.3 12/24/2021   PLT 290 12/24/2021   GLUCOSE 96 12/24/2021   CHOL 209 (H) 12/24/2021   TRIG 61 12/24/2021   HDL 60 12/24/2021   LDLCALC 138 (H) 12/24/2021   ALT 15 12/24/2021   AST 15 12/24/2021   NA 144 12/24/2021   K 4.8 12/24/2021   CL 105 12/24/2021   CREATININE 1.06 (H) 12/24/2021   BUN 17 12/24/2021   CO2 24 12/24/2021   TSH 2.430 12/24/2021      Assessment & Plan:   Problem List Items Addressed This Visit   None .  No orders of the defined types were placed in this encounter.   No orders of the defined types were placed in this encounter.    Follow-up: No follow-ups on file.  An After Visit Summary was printed and given to the  patient.  02/23/2022, NP [facility] [phone]

## 2022-06-28 NOTE — Assessment & Plan Note (Signed)
Continue heart healthy diet Continue regular physical activity

## 2022-06-28 NOTE — Patient Instructions (Signed)
Flu vaccine given in office Consider colon cancer screening Recommend routine eye exam Continue medications Follow-up in 1 year or sooner if needed   Preventing Vitamin D Deficiency Vitamin D deficiency is when your body does not have enough vitamin D. Vitamin D is important because it helps your body maintain calcium and phosphorus levels. It plays a key role in the health of bones and teeth, reduces inflammation, and improves the body's defense system (immune system). Our bodies make vitamin D when our skin is exposed to direct sunlight. However, for many people, this may not be enough vitamin D to meet the body's needs. How can this condition affect me? If vitamin D deficiency is severe, it can cause a condition in which a person's bones become softer than normal. In adults, this condition is called osteomalacia. In children, this condition is called rickets. Vitamin D deficiency can also cause weak or thin bones (osteoporosis) in adults. What can increase my risk? You may be at risk for a vitamin D deficiency if you: Are pregnant. Are obese. Are an older adult. Have dark skin. Take certain medicines that affect the way vitamin D is absorbed. Have had a surgery in which a part of the stomach or a part of the small intestine was removed. Other risk factors include: Having a condition that limits your ability to absorb fat, such as Crohn's disease, long-term (chronic) pancreatitis, or cystic fibrosis. Having certain conditions that are passed from parent to child (inherited). Not having access to foods rich in vitamin D. Having limited ability to move and go outside safely. Living in areas that have fewer hours of sunlight. Spending most of your day indoors, or covering your skin all the time when you are outdoors. What actions can I take to reduce my risk of a vitamin D deficiency? Knowing the best sources of vitamin D You can meet your daily vitamin D needs from: Foods. Dietary  supplements. Direct exposure to natural sunlight. Infant formula, for infants. Knowing how much vitamin D you need General recommendations for daily vitamin D intake vary by these categories: Infants: 400 international units (IU). Children older than 1 year: 600 international units. Adults: 600 international units. Pregnant and breastfeeding women: 600 international units. Adults older than 70 years: 800 international units. These are minimum levels of recommended amounts. Your health care provider may recommend a different amount of vitamin D intake based on your specific needs and your overall health. Getting sun exposure Get regular, safe exposure to natural sunlight. Expose your skin to direct sunlight for at least 15 minutes every day. If you have dark skin, you may need to expose your skin for a longer period of time. Protect your skin from too much sun exposure. This helps to prevent skin cancer. Ask your health care provider if regular sun exposure is safe for you. Do not use a tanning bed. Eating and drinking  Eat foods that naturally contain vitamin D. These include: Beef liver. Eggs. The vitamin D is in the yolk. Fish, such as salmon or trout. Mushrooms that were treated with UV light. Eat or drink products that have vitamin D added to them (are fortified). These may include: Cereals. Milk, including plant-based alternatives such as almond, soy, or oat milks. Orange juice. Margarine. When choosing foods, check the food label on the package to see: How much vitamin D is in the item. If the food is fortified with vitamin D. The items listed above may not be a complete list of  foods and beverages you can eat and drink. Contact a dietitian for more information. Taking supplements and medicines If you are at risk for vitamin D deficiency, or if you have certain diseases, your health care provider may recommend that you take a vitamin D supplement. Make sure you: Talk with your  health care provider before you start taking any vitamin D supplements. You may be more sensitive to the side effects of vitamin D supplements if you are on certain medicines or have certain medical conditions. Tell your health care provider about all medicines you are taking, including vitamins, herbs, eye drops, creams, and over-the-counter medicines. Take over-the-counter and prescription medicines only as told by your health care provider. Take supplements only as told by your health care provider. To increase absorption of your supplement, take it with a meal or snack. Summary Vitamin D plays a key role in the health of bones and teeth, reduces inflammation, and improves the body's defense system (immune system). A vitamin D deficiency can put you at risk of developing conditions such as rickets or osteoporosis. Our bodies make vitamin D when our skin is exposed to direct sunlight. However, for many people, this may not be enough vitamin D to meet the body's needs. Some foods naturally contain vitamin D, including beef liver, egg yolk, and fish. Eat or drink products that have vitamin D added to them (are fortified). This information is not intended to replace advice given to you by your health care provider. Make sure you discuss any questions you have with your health care provider. Document Revised: 04/10/2021 Document Reviewed: 04/10/2021 Elsevier Patient Education  2023 [employer].

## 2022-06-28 NOTE — Assessment & Plan Note (Signed)
Continue Vit D 50 K weekly Continue Vit D rich diet

## 2022-06-28 NOTE — Assessment & Plan Note (Signed)
Continue Singulair 10 mg QHS Continue Claritin 10 mg QD

## 2022-06-28 NOTE — Assessment & Plan Note (Signed)
-  continue Protonix 40 mg Qd -continue to avoid foods that trigger GERD

## 2022-06-28 NOTE — Progress Notes (Signed)
Subjective:  Patient ID: Martha Burgess, female    DOB: 1971/07/13  Age: 51 y.o. MRN: [id number]  Chief Complaint  Patient presents with   Follow-up    51M Vitamin D deficiency    HPI Pt presents for follow-up of Hyperlipidemia, GERD and Vit D deficiency. She has chronic bilateral hip pain due to osteoarthritis and seasonal allergic rhinitis. Hip OA treated with OTC NSAIDS.Treat rhinitis with Claritin and Singulair daily. States symptoms are currently well controlled.Reports her 61 year old father was recently diagnosed with Addison's disease. She is up-to-date on screening mammogram. She had seasonal flu vaccine today. She has deferred colon cancer screening until next year. Past due on routine eye exam. She is a former cigarette smoker.   Lipid/Cholesterol, Follow-up  Last lipid panel Other pertinent labs  Lab Results  Component Value Date   CHOL 209 (H) 12/24/2021   HDL 60 12/24/2021   LDLCALC 138 (H) 12/24/2021   TRIG 61 12/24/2021   CHOLHDL 3.5 12/24/2021   Lab Results  Component Value Date   ALT 15 12/24/2021   AST 15 12/24/2021   PLT 290 12/24/2021   TSH 2.430 12/24/2021     She was last seen for this 6 months ago.  Management includes heart healthy diet and regular physical activity. The 10-year ASCVD risk score (Arnett DK, et al., 2019) is: 3.4%    Vitamin D deficiency, follow-up  Lab Results  Component Value Date   VD25OH 21.2 (L) 12/24/2021   VD25OH 23.6 (L) 06/25/2021   VD25OH 21.5 (L) 11/27/2020   CALCIUM 10.2 12/24/2021   CALCIUM 9.7 06/25/2021   Wt Readings from Last 3 Encounters:  06/28/22 178 lb (80.7 kg)  12/24/21 178 lb (80.7 kg)  06/25/21 166 lb (75.3 kg)    She was last seen for vitamin D deficiency 6 months ago.  Management since that visit includes Vit D 50 K weekly and Vit D rich die. She reports excellent compliance with treatment. She is not having side effects  GERD, Follow up:  The patient was last seen for GERD 6 months  ago. Current treatment consist [id number] 40 mg QD and avoid foods that trigger GERD She reports excellent compliance with treatment. She is not having side effects.. She is NOT experiencing choking on food or dysphagia    Current Outpatient Medications on File Prior to Visit  Medication Sig Dispense Refill   albuterol (VENTOLIN HFA) 108 (90 Base) MCG/ACT inhaler Inhale two puffs every 4-6 hours if needed for coughing, wheezing, or shortness of breath. 1 each 1   Folic Acid-Vit [id number] 0.5-5-0.2 MG TABS Take 1 each by mouth daily. 30 tablet 2   montelukast (SINGULAIR) 10 MG tablet Take 1 tablet (10 mg total) by mouth at bedtime. 90 tablet 3   pantoprazole (PROTONIX) 40 MG tablet Take 1 tablet (40 mg total) by mouth daily. 90 tablet 3   Vitamin D, Ergocalciferol, (DRISDOL) 1.25 MG (50000 UNIT) CAPS capsule Take 1 capsule (50,000 Units total) by mouth every 7 (seven) days. 12 capsule 3   No current facility-administered medications on file prior to visit.   Past Medical History:  Diagnosis Date   Anemia    History of kidney stones    Hx of migraines    Seasonal allergies    Past Surgical History:  Procedure Laterality Date   Left Foot Surgery- 2020     4 screws and 1 staple placed in foot.   Right Shoulder Surgery- Torn Rotator Cuff/Tendon stretched- 2016  TUBAL LIGATION      Family History  Problem Relation Age of Onset   Heart attack Mother    Diabetes Mother    Hypertension Mother    Hypotension Father    Kidney failure Sister    Breast cancer Neg Hx    Social History   Socioeconomic History   Marital status: Married    Spouse name: Not on file   Number of children: 3   Years of education: Not on file   Highest education level: Not on file  Occupational History   Not on file  Tobacco Use   Smoking status: Former    Packs/day: 1.00    Types: Cigarettes    Quit date: 2022    Years since quitting: 1.9   Smokeless tobacco: Never  Vaping Use   Vaping Use:  Never used  Substance and Sexual Activity   Alcohol use: Never   Drug use: Never   Sexual activity: Not on file  Other Topics Concern   Not on file  Social History Narrative   Not on file   Social Determinants of Health   Financial Resource Strain: Low Risk  (12/24/2021)   Overall Financial Resource Strain (CARDIA)    Difficulty of Paying Living Expenses: Not hard at all  Food Insecurity: No Food Insecurity (12/24/2021)   Hunger Vital Sign    Worried About Running Out of Food in the Last Year: Never true    Ran Out of Food in the Last Year: Never true  Transportation Needs: No Transportation Needs (12/24/2021)   PRAPARE - Administrator, Civil Service (Medical): No    Lack of Transportation (Non-Medical): No  Physical Activity: Inactive (12/24/2021)   Exercise Vital Sign    Days of Exercise per Week: 0 days    Minutes of Exercise per Session: 0 min  Stress: No Stress Concern Present (12/24/2021)   [employer] of Occupational Health - Occupational Stress Questionnaire    Feeling of Stress : Not at all  Social Connections: Moderately Isolated (12/24/2021)   Social Connection and Isolation Panel [NHANES]    Frequency of Communication with Friends and Family: More than three times a week    Frequency of Social Gatherings with Friends and Family: More than three times a week    Attends Religious Services: Never    Database administrator or Organizations: No    Attends Banker Meetings: Never    Marital Status: Married    Review of Systems  Constitutional:  Negative for fatigue.  HENT:  Negative for congestion, ear pain and sore throat.   Respiratory:  Negative for cough and shortness of breath.   Cardiovascular:  Negative for chest pain.  Gastrointestinal:  Negative for abdominal pain, constipation, diarrhea, nausea and vomiting.  Genitourinary:  Negative for dysuria, frequency and urgency.  Musculoskeletal:  Positive for arthralgias (Hip). Negative for  back pain and myalgias.  Allergic/Immunologic: Positive for environmental allergies.  Neurological:  Negative for dizziness and headaches.  Psychiatric/Behavioral:  Negative for agitation and sleep disturbance. The patient is not nervous/anxious.      Objective:  BP 128/82 (BP Location: Left Arm, Patient Position: Sitting, Cuff Size: Large)   Pulse 80   Temp (!) 97.4 F (36.3 C) (Temporal)   Ht 5\' 9"  (1.753 m)   Wt 178 lb (80.7 kg)   SpO2 98%   BMI 26.29 kg/m         06/28/2022    7:30 AM 12/24/2021  7:52 AM 06/25/2021    3:40 PM  BP/Weight  Systolic BP  132 800  Diastolic BP  68 78  Wt. (Lbs) 178 178 166  BMI 26.29 kg/m2 27.06 kg/m2 25.24 kg/m2    Physical Exam Vitals reviewed.  Constitutional:      Appearance: Normal appearance.  Neck:     Vascular: No carotid bruit.  Cardiovascular:     Rate and Rhythm: Normal rate and regular rhythm.  Pulmonary:     Effort: Pulmonary effort is normal.     Breath sounds: Normal breath sounds.  Abdominal:     General: Bowel sounds are normal.  Musculoskeletal:        General: Normal range of motion.     Cervical back: Normal range of motion.  Skin:    General: Skin is warm.  Neurological:     Mental Status: She is alert.  Psychiatric:        Mood and Affect: Mood normal.        Behavior: Behavior normal.       Lab Results  Component Value Date   WBC 5.6 12/24/2021   HGB 14.7 12/24/2021   HCT 43.3 12/24/2021   PLT 290 12/24/2021   GLUCOSE 96 12/24/2021   CHOL 209 (H) 12/24/2021   TRIG 61 12/24/2021   HDL 60 12/24/2021   LDLCALC 138 (H) 12/24/2021   ALT 15 12/24/2021   AST 15 12/24/2021   NA 144 12/24/2021   K 4.8 12/24/2021   CL 105 12/24/2021   CREATININE 1.06 (H) 12/24/2021   BUN 17 12/24/2021   CO2 24 12/24/2021   TSH 2.430 12/24/2021      Assessment & Plan:    Problem List Items Addressed This Visit       Digestive   Gastroesophageal reflux disease    -continue Protonix 40 mg Qd -continue to  avoid foods that trigger GERD      Relevant Orders   CBC with Differential/Platelet   Comprehensive metabolic panel   Lipid panel   T4, free   TSH     Musculoskeletal and Integument   Primary osteoarthritis of right hip     Other   Seasonal allergies    Continue Singulair 10 mg QHS Continue Claritin 10 mg QD      Vitamin D deficiency    Continue Vit D 50 K weekly Continue Vit D rich diet      Relevant Orders   Vitamin D, 25-hydroxy   Hyperlipidemia - Primary    Continue heart healthy diet Continue regular physical activity      Relevant Orders   CBC with Differential/Platelet   Comprehensive metabolic panel   Lipid panel   Other Visit Diagnoses     Need for immunization against influenza       Relevant Orders   Flu Vaccine QUAD 70mo+IM (Fluarix, Fluzone & Alfiuria Quad PF) (Completed)   BMI 26.0-26.9,adult       Relevant Orders   CBC with Differential/Platelet   Comprehensive metabolic panel   Lipid panel   T4, free   TSH   Family history of Addison's disease       Non-seasonal allergic rhinitis, unspecified trigger            Flu vaccine given in office Consider colon cancer screening Recommend routine eye exam Continue medications Follow-up in 1 year or sooner if needed    Follow-up: 1-year or sooner if needed  An After Visit Summary was printed and given to  the patient.  I, Rip Harbour, NP, have reviewed all documentation for this visit. The documentation on 06/28/22 for the exam, diagnosis, procedures, and orders are all accurate and complete.   Rip Harbour, NP [place] [phone]

## 2022-06-29 LAB — COMPREHENSIVE METABOLIC PANEL
ALT: 10 IU/L (ref 0–32)
AST: 12 IU/L (ref 0–40)
Albumin/Globulin Ratio: 1.9 (ref 1.2–2.2)
Albumin: 4.1 g/dL (ref 3.9–4.9)
Alkaline Phosphatase: 90 IU/L (ref 44–121)
BUN/Creatinine Ratio: 14 (ref 9–23)
BUN: 11 mg/dL (ref 6–24)
Bilirubin Total: 0.5 mg/dL (ref 0.0–1.2)
CO2: 23 mmol/L (ref 20–29)
Calcium: 9.4 mg/dL (ref 8.7–10.2)
Chloride: 107 mmol/L — ABNORMAL HIGH (ref 96–106)
Creatinine, Ser: 0.8 mg/dL (ref 0.57–1.00)
Globulin, Total: 2.2 g/dL (ref 1.5–4.5)
Glucose: 87 mg/dL (ref 70–99)
Potassium: 4.3 mmol/L (ref 3.5–5.2)
Sodium: 140 mmol/L (ref 134–144)
Total Protein: 6.3 g/dL (ref 6.0–8.5)
eGFR: 90 mL/min/{1.73_m2} (ref >59)

## 2022-06-29 LAB — CBC WITH DIFFERENTIAL/PLATELET
Basophils Absolute: 0.1 10*3/uL (ref 0.0–0.2)
Basos: 1 %
EOS (ABSOLUTE): 0.3 10*3/uL (ref 0.0–0.4)
Eos: 5 %
Hematocrit: 41 % (ref 34.0–46.6)
Hemoglobin: 13.5 g/dL (ref 11.1–15.9)
Immature Grans (Abs): 0 10*3/uL (ref 0.0–0.1)
Immature Granulocytes: 0 %
Lymphocytes Absolute: 1.2 10*3/uL (ref 0.7–3.1)
Lymphs: 22 %
MCH: 29.4 pg (ref 26.6–33.0)
MCHC: 32.9 g/dL (ref 31.5–35.7)
MCV: 89 fL (ref 79–97)
Monocytes Absolute: 0.5 10*3/uL (ref 0.1–0.9)
Monocytes: 8 %
Neutrophils Absolute: 3.6 10*3/uL (ref 1.4–7.0)
Neutrophils: 64 %
Platelets: 269 10*3/uL (ref 150–450)
RBC: 4.59 x10E6/uL (ref 3.77–5.28)
RDW: 12.7 % (ref 11.7–15.4)
WBC: 5.6 10*3/uL (ref 3.4–10.8)

## 2022-06-29 LAB — TSH: TSH: 3.29 u[IU]/mL (ref 0.450–4.500)

## 2022-06-29 LAB — LIPID PANEL
Chol/HDL Ratio: 2.6 ratio (ref 0.0–4.4)
Cholesterol, Total: 155 mg/dL (ref 100–199)
HDL: 60 mg/dL (ref >39)
LDL Chol Calc (NIH): 82 mg/dL (ref 0–99)
Triglycerides: 68 mg/dL (ref 0–149)
VLDL Cholesterol Cal: 13 mg/dL (ref 5–40)

## 2022-06-29 LAB — CARDIOVASCULAR RISK ASSESSMENT

## 2022-06-29 LAB — T4, FREE: Free T4: 1.08 ng/dL (ref 0.82–1.77)

## 2022-06-29 LAB — VITAMIN D 25 HYDROXY (VIT D DEFICIENCY, FRACTURES): Vit D, 25-Hydroxy: 37.9 ng/mL (ref 30.0–100.0)

## 2022-12-14 ENCOUNTER — Telehealth: Payer: Self-pay

## 2022-12-14 NOTE — Telephone Encounter (Signed)
I left a message on the number(s) listed in the patients chart requesting the patient to call back regarding the upcomming appointment for 06/30/2023. The appointment was scheduled with Rekah, NP who is no longer here. The appointment has been canceled. Waiting for the patient to return the call.   It is ok for this appointment to be rescheduled with Langley Gauss, PA.  NOTE: A Mychart message was sent to the patient.

## 2023-06-30 ENCOUNTER — Ambulatory Visit: Payer: Managed Care, Other (non HMO) | Admitting: Nurse Practitioner
